# Patient Record
Sex: Female | Born: 1940 | Race: White | Hispanic: No | Marital: Married | Smoking: Never smoker
Health system: Southern US, Community
[De-identification: ages and names within clinical notes are randomized; demographics above are authoritative.]

## PROBLEM LIST (undated history)

## (undated) DIAGNOSIS — E049 Nontoxic goiter, unspecified: Secondary | ICD-10-CM

## (undated) DIAGNOSIS — N189 Chronic kidney disease, unspecified: Secondary | ICD-10-CM

## (undated) DIAGNOSIS — E079 Disorder of thyroid, unspecified: Secondary | ICD-10-CM

## (undated) DIAGNOSIS — I341 Nonrheumatic mitral (valve) prolapse: Secondary | ICD-10-CM

## (undated) DIAGNOSIS — I10 Essential (primary) hypertension: Secondary | ICD-10-CM

## (undated) DIAGNOSIS — K589 Irritable bowel syndrome without diarrhea: Secondary | ICD-10-CM

## (undated) DIAGNOSIS — F419 Anxiety disorder, unspecified: Secondary | ICD-10-CM

## (undated) DIAGNOSIS — M810 Age-related osteoporosis without current pathological fracture: Secondary | ICD-10-CM

## (undated) HISTORY — DX: Essential (primary) hypertension: I10

## (undated) HISTORY — DX: Age-related osteoporosis without current pathological fracture: M81.0

## (undated) HISTORY — DX: Nonrheumatic mitral (valve) prolapse: I34.1

## (undated) HISTORY — DX: Disorder of thyroid, unspecified: E07.9

## (undated) HISTORY — PX: ABLATION: SHX5711

## (undated) HISTORY — DX: Chronic kidney disease, unspecified: N18.9

## (undated) HISTORY — DX: Nontoxic goiter, unspecified: E04.9

## (undated) HISTORY — DX: Anxiety disorder, unspecified: F41.9

## (undated) HISTORY — DX: Irritable bowel syndrome, unspecified: K58.9

---
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
## 1966-10-22 HISTORY — PX: BREAST LUMPECTOMY: SHX2

## 1997-10-22 HISTORY — PX: ABDOMINAL HYSTERECTOMY: SHX81

## 1998-06-20 ENCOUNTER — Ambulatory Visit (HOSPITAL_COMMUNITY): Admission: RE | Admit: 1998-06-20 | Discharge: 1998-06-20 | Payer: Self-pay | Admitting: Gastroenterology

## 1999-04-04 ENCOUNTER — Other Ambulatory Visit: Admission: RE | Admit: 1999-04-04 | Discharge: 1999-04-04 | Payer: Self-pay | Admitting: *Deleted

## 1999-06-14 ENCOUNTER — Ambulatory Visit (HOSPITAL_COMMUNITY): Admission: RE | Admit: 1999-06-14 | Discharge: 1999-06-14 | Payer: Self-pay | Admitting: Gastroenterology

## 1999-06-28 ENCOUNTER — Ambulatory Visit (HOSPITAL_COMMUNITY): Admission: RE | Admit: 1999-06-28 | Discharge: 1999-06-28 | Payer: Self-pay | Admitting: Gastroenterology

## 1999-06-28 ENCOUNTER — Encounter: Payer: Self-pay | Admitting: Gastroenterology

## 2001-05-06 ENCOUNTER — Other Ambulatory Visit: Admission: RE | Admit: 2001-05-06 | Discharge: 2001-05-06 | Payer: Self-pay | Admitting: *Deleted

## 2002-09-14 ENCOUNTER — Encounter: Payer: Self-pay | Admitting: Gastroenterology

## 2002-09-14 ENCOUNTER — Encounter: Admission: RE | Admit: 2002-09-14 | Discharge: 2002-09-14 | Payer: Self-pay | Admitting: Gastroenterology

## 2007-01-02 ENCOUNTER — Emergency Department (HOSPITAL_COMMUNITY): Admission: EM | Admit: 2007-01-02 | Discharge: 2007-01-02 | Payer: Self-pay | Admitting: Emergency Medicine

## 2007-07-17 ENCOUNTER — Ambulatory Visit: Payer: Self-pay | Admitting: *Deleted

## 2008-09-14 ENCOUNTER — Encounter (INDEPENDENT_AMBULATORY_CARE_PROVIDER_SITE_OTHER): Payer: Self-pay | Admitting: Interventional Radiology

## 2008-09-14 ENCOUNTER — Encounter: Admission: RE | Admit: 2008-09-14 | Discharge: 2008-09-14 | Payer: Self-pay | Admitting: Internal Medicine

## 2008-09-14 ENCOUNTER — Other Ambulatory Visit: Admission: RE | Admit: 2008-09-14 | Discharge: 2008-09-14 | Payer: Self-pay | Admitting: Interventional Radiology

## 2010-06-06 ENCOUNTER — Emergency Department (HOSPITAL_COMMUNITY)
Admission: EM | Admit: 2010-06-06 | Discharge: 2010-06-06 | Payer: Self-pay | Source: Home / Self Care | Admitting: Emergency Medicine

## 2010-12-07 ENCOUNTER — Other Ambulatory Visit: Payer: Self-pay | Admitting: Internal Medicine

## 2010-12-07 DIAGNOSIS — R4182 Altered mental status, unspecified: Secondary | ICD-10-CM

## 2010-12-08 ENCOUNTER — Ambulatory Visit
Admission: RE | Admit: 2010-12-08 | Discharge: 2010-12-08 | Disposition: A | Discharge status: Home / Self Care | Payer: Medicare Other | Source: Ambulatory Visit | Attending: Internal Medicine | Admitting: Internal Medicine

## 2010-12-08 DIAGNOSIS — R4182 Altered mental status, unspecified: Secondary | ICD-10-CM

## 2011-01-05 LAB — URINE CULTURE
Colony Count: NO GROWTH
Culture  Setup Time: 201108162044

## 2011-01-05 LAB — COMPREHENSIVE METABOLIC PANEL
AST: 22 U/L (ref 0–37)
Albumin: 3.6 g/dL (ref 3.5–5.2)
BUN: 14 mg/dL (ref 6–23)
Calcium: 9.3 mg/dL (ref 8.4–10.5)
Chloride: 108 mEq/L (ref 96–112)
Creatinine, Ser: 1.08 mg/dL (ref 0.4–1.2)
GFR calc Af Amer: >60 mL/min (ref >60)
Total Protein: 6.7 g/dL (ref 6.0–8.3)

## 2011-01-05 LAB — URINALYSIS, ROUTINE W REFLEX MICROSCOPIC
Bilirubin Urine: NEGATIVE
Ketones, ur: NEGATIVE mg/dL
Nitrite: NEGATIVE
Protein, ur: NEGATIVE mg/dL
pH: 7 (ref 5.0–8.0)

## 2011-01-05 LAB — URINE MICROSCOPIC-ADD ON

## 2011-01-05 LAB — GLUCOSE, CAPILLARY: Glucose-Capillary: 119 mg/dL — ABNORMAL HIGH (ref 70–99)

## 2011-01-05 LAB — DIFFERENTIAL
Basophils Absolute: 0 10*3/uL (ref 0.0–0.1)
Eosinophils Relative: 2 % (ref 0–5)
Lymphocytes Relative: 24 % (ref 12–46)
Lymphs Abs: 2 10*3/uL (ref 0.7–4.0)
Monocytes Absolute: 0.7 10*3/uL (ref 0.1–1.0)
Monocytes Relative: 8 % (ref 3–12)
Neutro Abs: 5.6 10*3/uL (ref 1.7–7.7)

## 2011-01-05 LAB — CBC
MCV: 90.7 fL (ref 78.0–100.0)
Platelets: 306 10*3/uL (ref 150–400)
RBC: 4.51 MIL/uL (ref 3.87–5.11)
RDW: 13.1 % (ref 11.5–15.5)
WBC: 8.5 10*3/uL (ref 4.0–10.5)

## 2011-01-05 LAB — APTT: aPTT: 25 seconds (ref 24–37)

## 2011-01-05 LAB — CK TOTAL AND CKMB (NOT AT ARMC)
CK, MB: 0.9 ng/mL (ref 0.3–4.0)
Total CK: 53 U/L (ref 7–177)

## 2011-01-05 LAB — TROPONIN I: Troponin I: 0.01 ng/mL (ref 0.00–0.06)

## 2011-03-06 NOTE — Procedures (Signed)
CAROTID DUPLEX EXAM   INDICATION:  Bilateral carotid bruit.   HISTORY:  Diabetes:  No  Cardiac:  Mitral valve prolapse  Hypertension:  Yes  Smoking:  No  Previous Surgery:  No  CV History:  No  Amaurosis Fugax No, Paresthesias No, Hemiparesis No                                       RIGHT             LEFT  Brachial systolic pressure:  Brachial Doppler waveforms:         biphasic          biphasic  Vertebral direction of flow:        antegrade         antegrade  DUPLEX VELOCITIES (cm/sec)  CCA peak systolic                   85                76  ECA peak systolic                   78                79  ICA peak systolic                   149 (mid)         152 (distal)  ICA end diastolic                   51                27  PLAQUE MORPHOLOGY:                  heterogenous      heterogenous  PLAQUE AMOUNT:                      mild              mild  PLAQUE LOCATION:                    ICA               ICA   IMPRESSION:  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  Antegrade bilateral vertebral arteries.   INCIDENTAL FINDINGS:  A nodule that measured 2.24 x 3.2 cm noted in the  left thyroid gland.   ___________________________________________  P. Liliane Bade, M.D.   MG/MEDQ  D:  07/17/2007  T:  07/18/2007  Job:  [id number]

## 2012-03-05 ENCOUNTER — Other Ambulatory Visit: Payer: Self-pay | Admitting: Family Medicine

## 2012-03-05 DIAGNOSIS — I6529 Occlusion and stenosis of unspecified carotid artery: Secondary | ICD-10-CM

## 2012-03-05 DIAGNOSIS — R0989 Other specified symptoms and signs involving the circulatory and respiratory systems: Secondary | ICD-10-CM

## 2012-03-05 DIAGNOSIS — E049 Nontoxic goiter, unspecified: Secondary | ICD-10-CM

## 2012-03-07 ENCOUNTER — Ambulatory Visit
Admission: RE | Admit: 2012-03-07 | Discharge: 2012-03-07 | Disposition: A | Discharge status: Home / Self Care | Payer: Medicare Other | Source: Ambulatory Visit | Attending: Family Medicine | Admitting: Family Medicine

## 2012-03-07 DIAGNOSIS — E049 Nontoxic goiter, unspecified: Secondary | ICD-10-CM

## 2012-03-07 DIAGNOSIS — R0989 Other specified symptoms and signs involving the circulatory and respiratory systems: Secondary | ICD-10-CM

## 2012-03-07 DIAGNOSIS — I6529 Occlusion and stenosis of unspecified carotid artery: Secondary | ICD-10-CM

## 2013-03-13 ENCOUNTER — Other Ambulatory Visit: Payer: Self-pay | Admitting: Family Medicine

## 2013-03-13 DIAGNOSIS — E049 Nontoxic goiter, unspecified: Secondary | ICD-10-CM

## 2013-03-19 ENCOUNTER — Ambulatory Visit
Admission: RE | Admit: 2013-03-19 | Discharge: 2013-03-19 | Disposition: A | Discharge status: Home / Self Care | Payer: Medicare Other | Source: Ambulatory Visit | Attending: Family Medicine | Admitting: Family Medicine

## 2013-03-19 DIAGNOSIS — E049 Nontoxic goiter, unspecified: Secondary | ICD-10-CM

## 2014-03-29 ENCOUNTER — Other Ambulatory Visit: Payer: Self-pay | Admitting: Family Medicine

## 2014-03-29 DIAGNOSIS — E041 Nontoxic single thyroid nodule: Secondary | ICD-10-CM

## 2014-04-12 ENCOUNTER — Encounter (INDEPENDENT_AMBULATORY_CARE_PROVIDER_SITE_OTHER): Payer: Self-pay

## 2014-04-12 ENCOUNTER — Ambulatory Visit
Admission: RE | Admit: 2014-04-12 | Discharge: 2014-04-12 | Disposition: A | Discharge status: Home / Self Care | Payer: Medicare Other | Source: Ambulatory Visit | Attending: Family Medicine | Admitting: Family Medicine

## 2014-04-12 DIAGNOSIS — E041 Nontoxic single thyroid nodule: Secondary | ICD-10-CM

## 2016-05-16 ENCOUNTER — Other Ambulatory Visit: Payer: Self-pay | Admitting: Family Medicine

## 2016-05-16 ENCOUNTER — Ambulatory Visit
Admission: RE | Admit: 2016-05-16 | Discharge: 2016-05-16 | Disposition: A | Discharge status: Home / Self Care | Payer: Medicare Other | Source: Ambulatory Visit | Attending: Family Medicine | Admitting: Family Medicine

## 2016-05-16 DIAGNOSIS — E041 Nontoxic single thyroid nodule: Secondary | ICD-10-CM

## 2016-12-18 ENCOUNTER — Other Ambulatory Visit: Payer: Self-pay | Admitting: Family Medicine

## 2016-12-18 DIAGNOSIS — I739 Peripheral vascular disease, unspecified: Secondary | ICD-10-CM

## 2016-12-20 ENCOUNTER — Ambulatory Visit
Admission: RE | Admit: 2016-12-20 | Discharge: 2016-12-20 | Disposition: A | Discharge status: Home / Self Care | Payer: Medicare Other | Source: Ambulatory Visit | Attending: Family Medicine | Admitting: Family Medicine

## 2016-12-20 DIAGNOSIS — I739 Peripheral vascular disease, unspecified: Secondary | ICD-10-CM

## 2017-03-16 IMAGING — US US THYROID
1 series · 13 of 25 positions shown · non-contrast
Comparison: 04/12/2014; 03/19/2013; 03/07/2012

CLINICAL DATA: Follow-up thyroid nodules. History of radioactive
iodine therapy in [REDACTED]. History of prior thyroid biopsy in
4998.

EXAM:
THYROID ULTRASOUND
TECHNIQUE: Ultrasound examination of the thyroid gland and adjacent soft
tissues was performed.

[Series 1: us thyroid · 0.07mm/px · 13 of 43 slices shown]
[im 1/43]
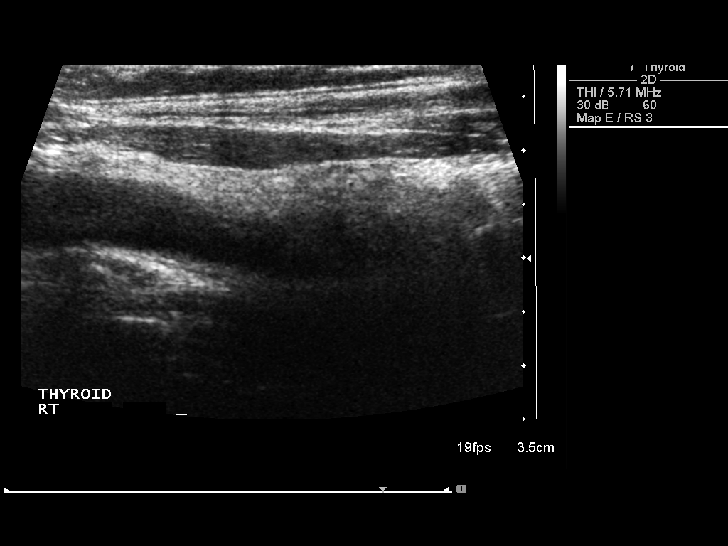
[im 4/43]
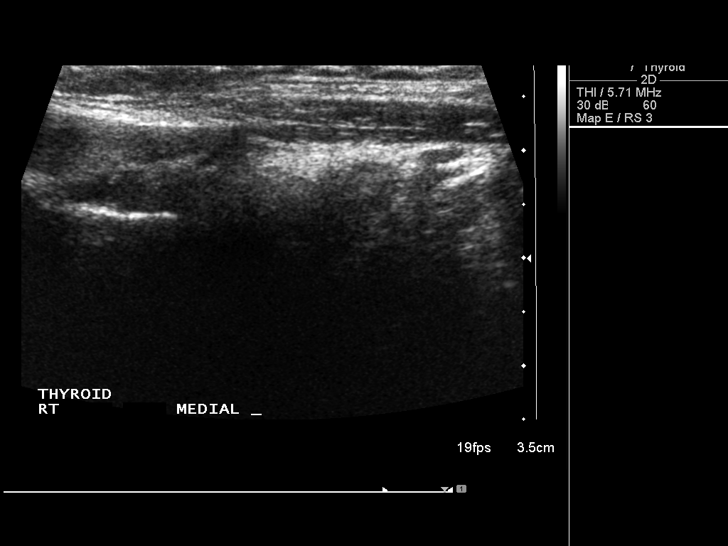
[im 8/43]
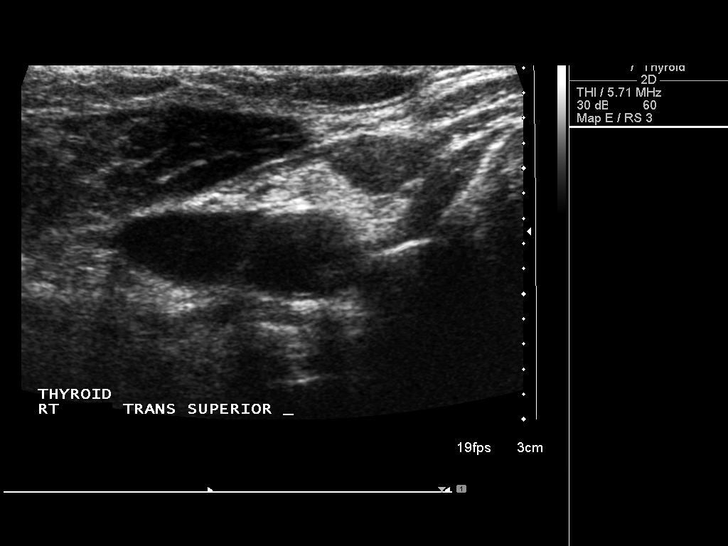
[im 11/43]
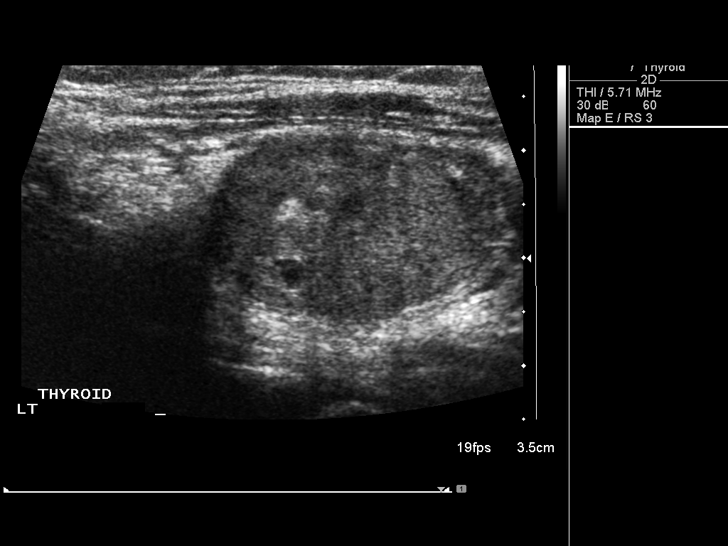
[im 15/43]
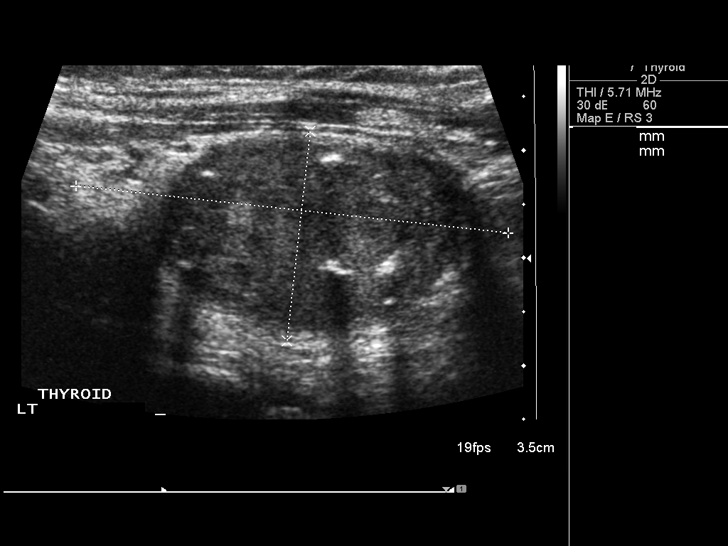
[im 18/43]
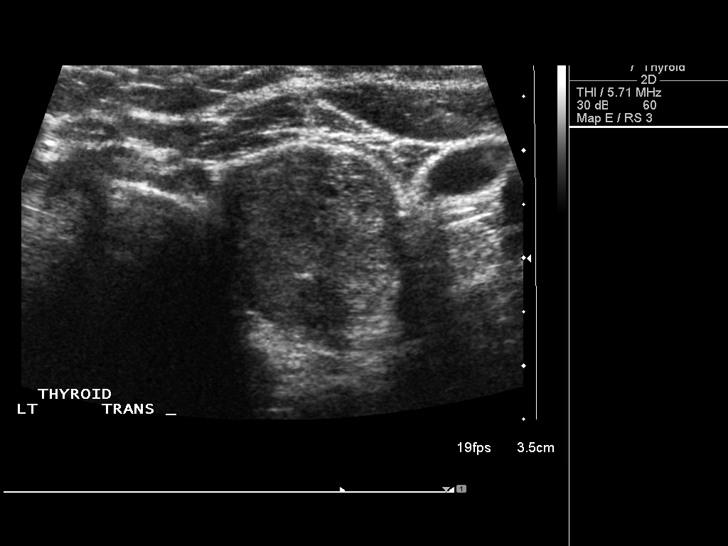
[im 22/43]
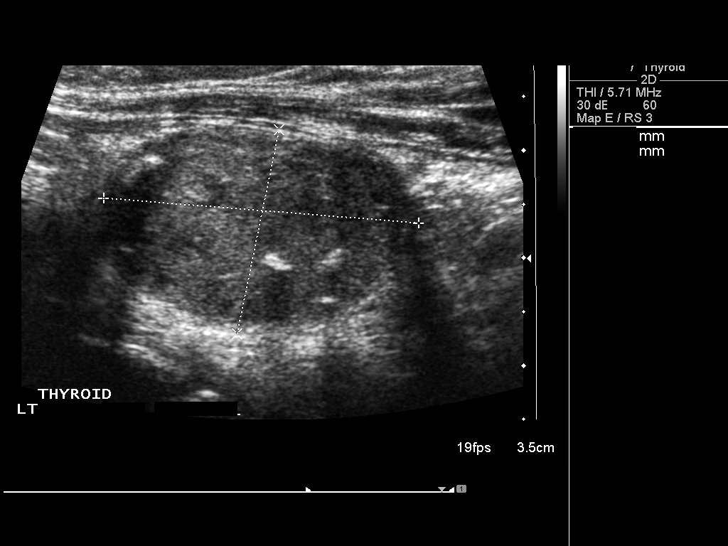
[im 25/43]
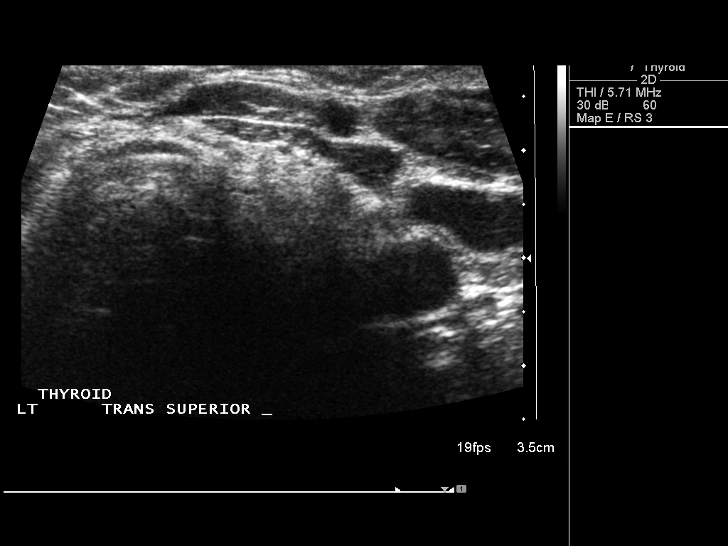
[im 29/43]
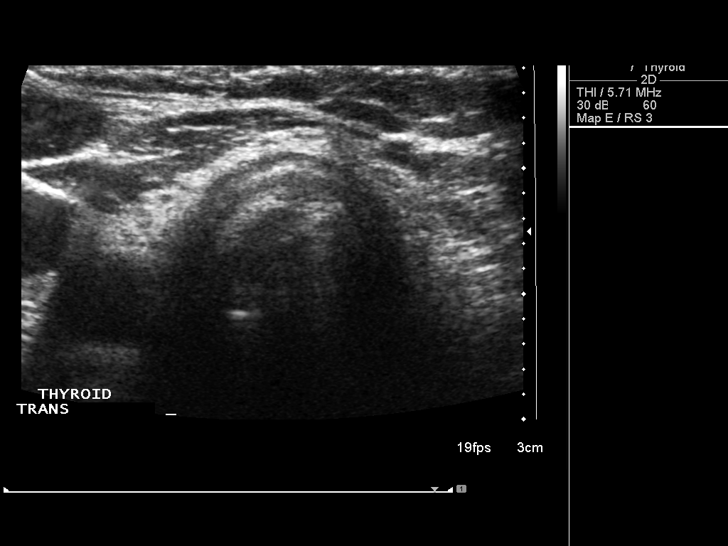
[im 32/43]
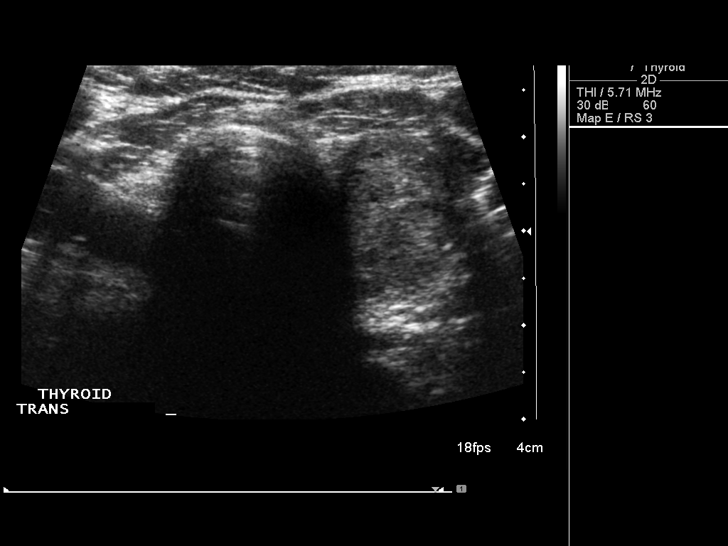
[im 36/43]
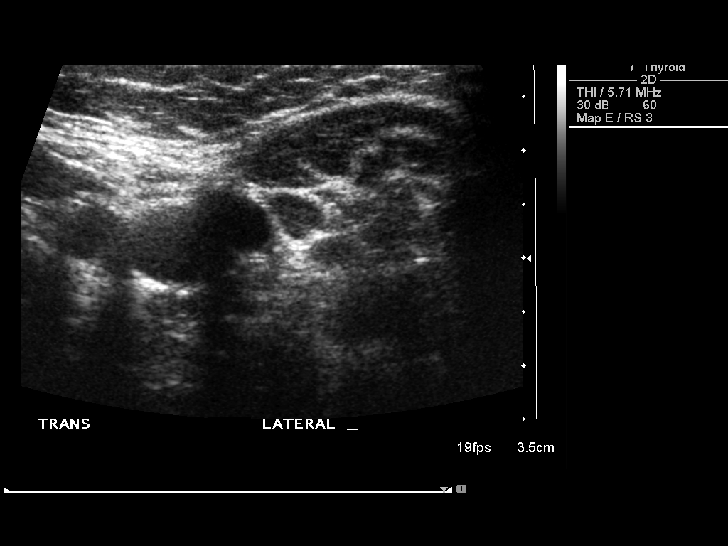
[im 39/43]
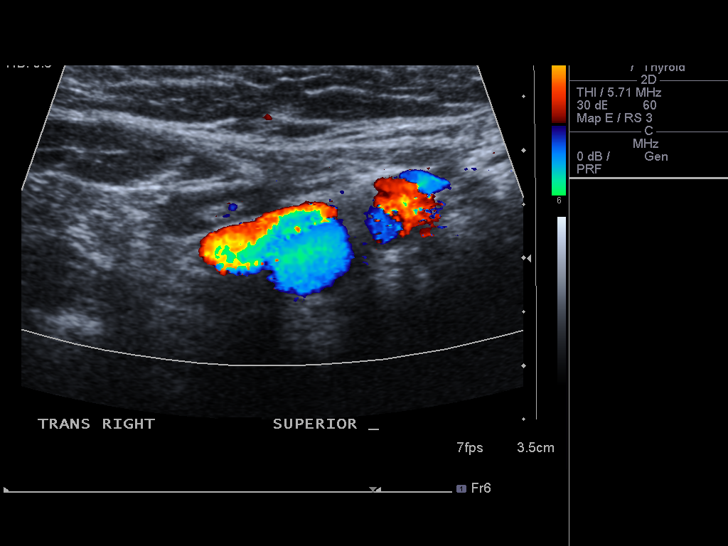
[im 43/43]
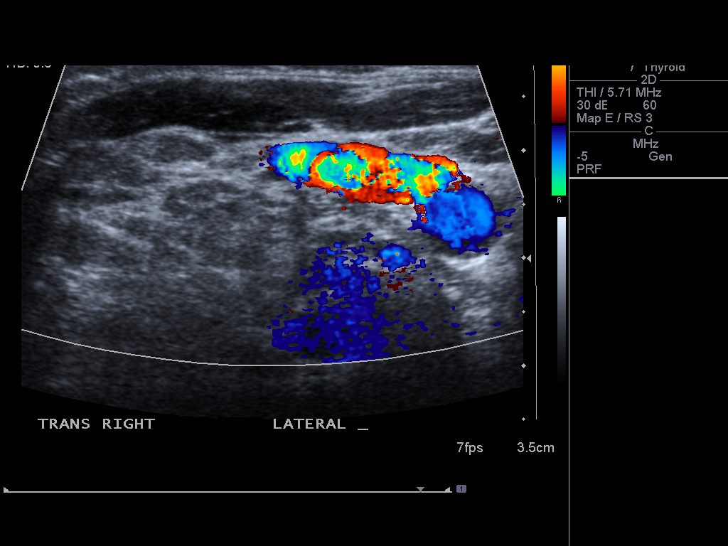

[13 of 25 positions shown; findings below may reference images not displayed]

FINDINGS: There is unchanged mild diffuse heterogeneity of the thyroid
parenchymal echotexture. No new or enlarging thyroid nodules.

Right thyroid lobe

Measurements: Diminutive in size measuring 3.0 x 0.5 x 0.7 cm,
unchanged, previously, 4.1 x 0.5 x 0.8 cm.

No discrete nodule or mass identified within the right lobe of the
thyroid.

Left thyroid lobe

Measurements: Normal in size measuring 4.0 x 2.0 x 2.0 cm,
unchanged, previously, 4.2 x 2.1 x 1.9 cm

Left, mid, inferior - 2.0 x 2.9 x 1.9 cm - mixed echogenic, solid
with internal echogenic foci with dystrophic posterior acoustic
shadowing compatible with calcifications - nodules grossly unchanged
since remote thyroid ultrasound performed [DATE] at which time the
nodule measured approximately 1.9 x 3.0 x 1.7 cm.

Isthmus

Thickness: Normal in size measures 0.2 cm in diameter

No discrete nodule or mass is identified within the thyroid isthmus.

Lymphadenopathy

None visualized.
IMPRESSION: 1. No new or enlarging thyroid nodules. Solitary complex
approximately 2.9 cm nodule within the left lobe of the thyroid is
unchanged since the [DATE] examination.
2. Unchanged atrophy of the right lobe of the thyroid likely
compatible with provided history of remote radioactive iodine
ablation

## 2017-07-23 ENCOUNTER — Encounter (INDEPENDENT_AMBULATORY_CARE_PROVIDER_SITE_OTHER): Payer: Self-pay | Admitting: Orthopaedic Surgery

## 2017-07-23 ENCOUNTER — Ambulatory Visit (INDEPENDENT_AMBULATORY_CARE_PROVIDER_SITE_OTHER): Payer: Medicare Other | Admitting: Orthopaedic Surgery

## 2017-07-23 ENCOUNTER — Ambulatory Visit (INDEPENDENT_AMBULATORY_CARE_PROVIDER_SITE_OTHER): Payer: Medicare Other

## 2017-07-23 DIAGNOSIS — G8929 Other chronic pain: Secondary | ICD-10-CM | POA: Diagnosis not present

## 2017-07-23 DIAGNOSIS — M1711 Unilateral primary osteoarthritis, right knee: Secondary | ICD-10-CM

## 2017-07-23 DIAGNOSIS — M25561 Pain in right knee: Secondary | ICD-10-CM

## 2017-07-23 MED ORDER — BUPIVACAINE HCL 0.5 % IJ SOLN
2.0000 mL | INTRAMUSCULAR | Status: AC | PRN
  Administered 2017-07-23: 2 mL via INTRA_ARTICULAR

## 2017-07-23 MED ORDER — METHYLPREDNISOLONE ACETATE 40 MG/ML IJ SUSP
40.0000 mg | INTRAMUSCULAR | Status: AC | PRN
  Administered 2017-07-23: 40 mg via INTRA_ARTICULAR

## 2017-07-23 MED ORDER — LIDOCAINE HCL 1 % IJ SOLN
2.0000 mL | INTRAMUSCULAR | Status: AC | PRN
  Administered 2017-07-23: 2 mL

## 2017-07-23 NOTE — Progress Notes (Signed)
Office Visit Note   Patient: Laura Raymond           Date of Birth: 05/16/1941           MRN: [id number] Visit Date: 07/23/2017              Requested by: Lupita Raider, MD [address] [place], [place] [address] PCP: Lupita Raider, MD   Assessment & Plan: Visit Diagnoses:  1. Primary osteoarthritis of right knee     Plan: Overall impression is degenerative lateral meniscal tear with moderate osteoarthritis. We performed a cortisone injection today as well as referred her to physical therapy for quadriceps strengthening. Questions encouraged and answered. If not better she should follow-up and we may need to consider advanced imaging.  Follow-Up Instructions: Return if symptoms worsen or fail to improve.   Orders:  Orders Placed This Encounter  Procedures  . XR KNEE 3 VIEW RIGHT   No orders of the defined types were placed in this encounter.     Procedures: Large Joint Inj Date/Time: 07/23/2017 10:59 AM Performed by: Tarry Kos Authorized by: Tarry Kos   Consent Given by:  Patient Timeout: prior to procedure the correct patient, procedure, and site was verified   Indications:  Pain Location:  Knee Site:  R knee Prep: patient was prepped and draped in usual sterile fashion   Needle Size:  22 G Approach:  Lateral Ultrasound Guidance: No   Fluoroscopic Guidance: No   Arthrogram: No   Medications:  2 mL bupivacaine 0.5 %; 40 mg methylPREDNISolone acetate 40 MG/ML; 2 mL lidocaine 1 % Patient tolerance:  Patient tolerated the procedure well with no immediate complications     Clinical Data: No additional findings.   Subjective: Chief Complaint  Patient presents with  . Right Knee - Pain    Patient is a very pleasant 76 year old female who comes in with one-year history of right lateral knee pain with giving way and catching sensation. She has not fallen but she is very afraid that she will. The pain is worse with prolonged flexion  of the knee or sitting. She does have start up pain and stiffness. Denies any numbness and tingling. Denies any previous surgeries.    Review of Systems  Constitutional: Negative.   HENT: Negative.   Eyes: Negative.   Respiratory: Negative.   Cardiovascular: Negative.   Endocrine: Negative.   Musculoskeletal: Negative.   Neurological: Negative.   Hematological: Negative.   Psychiatric/Behavioral: Negative.   All other systems reviewed and are negative.    Objective: Vital Signs: There were no vitals taken for this visit.  Physical Exam  Constitutional: She is oriented to person, place, and time. She appears well-developed and well-nourished.  HENT:  Head: Normocephalic and atraumatic.  Eyes: EOM are normal.  Neck: Neck supple.  Pulmonary/Chest: Effort normal.  Abdominal: Soft.  Neurological: She is alert and oriented to person, place, and time.  Skin: Skin is warm. Capillary refill takes less than 2 seconds.  Psychiatric: She has a normal mood and affect. Her behavior is normal. Judgment and thought content normal.  Nursing note and vitals reviewed.   Ortho Exam Right knee exam shows no joint effusion. Collaterals and cruciates are stable. Lateral joint line tenderness. She does have popping at the lateral joint line with McMurray testing. Specialty Comments:  No specialty comments available.  Imaging: Xr Knee 3 View Right  Result Date: 07/23/2017 Moderate OA    PMFS History: There  are no active problems to display for this patient.  No past medical history on file.  No family history on file.  No past surgical history on file. Social History   Occupational History  . Not on file.   Social History Main Topics  . Smoking status: Never Smoker  . Smokeless tobacco: Never Used  . Alcohol use Not on file  . Drug use: Unknown  . Sexual activity: Not on file

## 2017-08-26 HISTORY — PX: OTHER SURGICAL HISTORY: SHX169

## 2017-08-26 HISTORY — PX: COLONOSCOPY: SHX174

## 2018-01-07 ENCOUNTER — Ambulatory Visit: Payer: Medicare Other | Attending: Family Medicine | Admitting: Physical Therapy

## 2018-01-07 ENCOUNTER — Encounter: Payer: Self-pay | Admitting: Physical Therapy

## 2018-01-07 DIAGNOSIS — M6281 Muscle weakness (generalized): Secondary | ICD-10-CM | POA: Insufficient documentation

## 2018-01-07 DIAGNOSIS — R279 Unspecified lack of coordination: Secondary | ICD-10-CM | POA: Diagnosis present

## 2018-01-07 DIAGNOSIS — M62838 Other muscle spasm: Secondary | ICD-10-CM

## 2018-01-07 NOTE — Therapy (Signed)
[facility] [address] [place], [place], [address] Phone: [phone]   Fax:  [phone]  Physical Therapy Evaluation  Patient Details  Name: Laura StabsBarbara A Mcdaniel MRN: [id number] Date of Birth: 1941-07-27 Referring Provider: Dr. Rockney GheeKimberlee D. Clelia CroftShaw   Encounter Date: 01/07/2018  PT End of Session - 01/07/18 1238    Visit Number  1    Date for PT Re-Evaluation  03/04/18    Authorization Type  [employer] Medicare    PT Start Time  1100    PT Stop Time  1145    PT Time Calculation (min)  45 min    Activity Tolerance  Patient tolerated treatment well    Behavior During Therapy  WFL for tasks assessed/performed       Past Medical History:  Diagnosis Date  . Anxiety   . Goiter   . Hypertension   . IBS (irritable bowel syndrome)   . Mitral valve prolapse   . Osteoporosis   . Thyroid disease     Past Surgical History:  Procedure Laterality Date  . ABDOMINAL HYSTERECTOMY  1999  . ABLATION    . APPENDECTOMY  1962  . BREAST LUMPECTOMY Left 1968  . COLONOSCOPY  08/26/2017  . endocopsy  08/26/2017    There were no vitals filed for this visit.   Subjective Assessment - 01/07/18 1112    Subjective  Fecal incontinence started late 30's.   Started sporadic.  The past several years have been more frequent.  Thinks it is from the high forcep delivery.  Saw Dr. Marca AnconaKarki for gastroentiology. The doctor feels it is a weak internal sphincter.  Patient is able to pass a small stool without effort when not expecting.  Patient will constipate herself due to the leakage.  Taking the trash out and leaked stool. Patient carries a change in clothes.  Patient reports she will have a bowel movement when she goes to the bathroom. Patient will loose stool one time per day without realizing it.      Patient Stated Goals  reduce fecal leakage    Currently in Pain?  No/denies    Multiple Pain Sites  No         [facility] PT Assessment - 01/07/18  0001      Assessment   Medical Diagnosis  R15.9 Fecal incontinence    Referring Provider  Dr. Rockney GheeKimberlee D. Shaw    Onset Date/Surgical Date  07/22/18    Prior Therapy  None      Precautions   Precautions  Other (comment)    Precaution Comments  osteoporosis      Restrictions   Weight Bearing Restrictions  No      Balance Screen   Has the patient fallen in the past 6 months  No    Has the patient had a decrease in activity level because of a fear of falling?   No    Is the patient reluctant to leave their home because of a fear of falling?   No      Home Public house managernvironment   Living Environment  Private residence      Prior Function   Level of Independence  Independent    Vocation  Retired    Leisure  no exercise regularly      Cognition   Overall Cognitive Status  Within Functional Limits for tasks assessed      Observation/Other Assessments   Focus on Therapeutic Outcomes (FOTO)   patient is limited  by 50% due to fecal leakage      Posture/Postural Control   Posture/Postural Control  Postural limitations    Postural Limitations  Rounded Shoulders;Forward head;Increased thoracic kyphosis      ROM / Strength   AROM / PROM / Strength  AROM;PROM;Strength      AROM   Overall AROM Comments  lumbar ROM is full      PROM   Right Hip External Rotation   30    Left Hip Extension  0    Left Hip Flexion  105    Left Hip External Rotation   35    Left Hip ABduction  10      Strength   Right Hip Extension  4/5    Right Hip ABduction  4/5    Left Hip Extension  4/5    Left Hip ABduction  4/5      Right Hip   Right Hip Extension  0    Right Hip Flexion  125    Right Hip ABduction  20      Palpation   SI assessment   pelvis in correct alignment    Palpation comment  tightness in the abdomen             Objective measurements completed on examination: See above findings.    Pelvic Floor Special Questions - 01/07/18 0001    Are you Pregnant or attempting pregnancy?   No    Prior Pregnancies  Yes    Number of Pregnancies  3    Number of C-Sections  0    Number of Vaginal Deliveries  2    Any difficulty with labor and deliveries  Yes high forcep and episiotomy delivery    Episiotomy Performed  Yes    Currently Sexually Active  -- last time was hurt     Urinary Leakage  Yes    Activities that cause leaking  With strong urge few drops when trying to get to the bathroom    Fecal incontinence  Yes    Falling out feeling (prolapse)  No    External Perineal Exam  anal area is intact     Pelvic Floor Internal Exam  Patient confirms identificaiton and approves PT to assess muscle integrity and treatment    Exam Type  Rectal    Palpation  tickness located on the left side of the sphincter, decreased movement of the puborectalis    Strength  weak squeeze, no lift left anterior lateral internal sphincter barely contracts                 PT Short Term Goals - 01/07/18 1249      PT SHORT TERM GOAL #1   Title  independent with initial HEP    Time  4    Period  Weeks    Status  New    Target Date  02/04/18      PT SHORT TERM GOAL #2   Title  understand correct bowel movement to relax the pelvic floor and push out the stools    Time  4    Period  Weeks    Status  New    Target Date  02/04/18      PT SHORT TERM GOAL #3   Title  understand bowel health to reduce her from constipating herself    Time  4    Period  Weeks    Status  New    Target Date  02/04/18  PT SHORT TERM GOAL #4   Title  understand how to perform abdominal massage to promote peristalic motion of the intestines to promote bowel movement    Time  4    Period  Weeks    Status  New    Target Date  02/04/18        PT Long Term Goals - 01/07/18 1253      PT LONG TERM GOAL #1   Title  independent with HEP and understand how to progress herself    Time  8    Period  Weeks    Status  New    Target Date  03/04/18      PT LONG TERM GOAL #2   Title  fecal  incontinence decresaed >/= 75% and patient is aware of fecal leakage    Time  8    Period  Weeks    Status  New    Target Date  03/04/18      PT LONG TERM GOAL #3   Title  increased flexibility of bilateral hips so she is able to sit on the commode with her knees higher than hips to have a proper bowel movement    Time  8    Period  Weeks    Status  New    Target Date  03/04/18      PT LONG TERM GOAL #4   Title  does not have to wear extra toilet paper tucked into the anal region due to fecal leakage    Time  8    Period  Weeks    Status  New    Target Date  03/04/18      PT LONG TERM GOAL #5   Title  pelvic floor strength >/= 3/5 with good lift to reduce pelvic leakage    Time  5    Period  Weeks    Status  New    Target Date  03/04/18             Plan - 01/07/18 1239    Clinical Impression Statement  Patient is a 77 year old female with fecal incontince that has become progressively worse in the past 6 months.  Patient reports it began when she had her first pregnancy.  Patient reports she will not realize she has lost stools until she goes to the commode. Patient will eat foods that make her constipated so she is able to go out in public.  Patient has a bowel movement everytime she urinates that is a nugget size. Pelvic floor strength is 2/5 without a lift.  Internal sphincte has difficulty with contracting on the left anteriorly. The puborectalis has minimal contraction.  Thickness is felt on the left anal sphincter.  PROM of hips: right /left external rotation 30/35, extension 0/0, flexion 125/105, and abduction 20/10.  Bilateral hip abduction and abduction strength is 4/5.  Increased tightness in abdominal area. Patient will benefit from skilled therapy to improve coordination of the pelvic floor contraction and improve awareness of fecal leakage.     History and Personal Factors relevant to plan of care:  osteoporosis, hypothyroidism, Hysterectomy    Clinical Presentation   Evolving    Clinical Presentation due to:  frequent bowel movements, makes her constipated so she is able to go out, does not realize she passed stool    Clinical Decision Making  Moderate    Rehab Potential  Excellent    Clinical Impairments Affecting Rehab Potential  osteoporosis,  hypothyroidism, Hysterectomy    PT Frequency  1x / week    PT Duration  8 weeks    PT Treatment/Interventions  Biofeedback;Therapeutic activities;Therapeutic exercise;Patient/family education;Neuromuscular re-education;Manual techniques;Passive range of motion;Dry needling    PT Next Visit Plan  abdominal massage, internal soft tissue work with tapping to increase contraction, hip stretches, toileting, bowel health    PT Home Exercise Plan  toileting, abdominal massage    Consulted and Agree with Plan of Care  Patient       Patient will benefit from skilled therapeutic intervention in order to improve the following deficits and impairments:  Increased fascial restricitons, Decreased mobility, Increased muscle spasms, Decreased strength, Decreased range of motion, Decreased coordination  Visit Diagnosis: Muscle weakness (generalized) - Plan: PT plan of care cert/re-cert  Unspecified lack of coordination - Plan: PT plan of care cert/re-cert  Other muscle spasm - Plan: PT plan of care cert/re-cert     Problem List There are no active problems to display for this patient.   Eulis Foster, PT 01/07/18 1:00 PM   [facility] [address] [place], [place], [address] Phone: [phone]   Fax:  [phone]  Name: ANIKAH HOGGE MRN: [id number] Date of Birth: September 08, 1941

## 2018-01-14 ENCOUNTER — Encounter: Payer: Self-pay | Admitting: Physical Therapy

## 2018-01-14 ENCOUNTER — Ambulatory Visit: Payer: Medicare Other | Admitting: Physical Therapy

## 2018-01-14 DIAGNOSIS — R279 Unspecified lack of coordination: Secondary | ICD-10-CM

## 2018-01-14 DIAGNOSIS — M6281 Muscle weakness (generalized): Secondary | ICD-10-CM

## 2018-01-14 DIAGNOSIS — M62838 Other muscle spasm: Secondary | ICD-10-CM

## 2018-01-14 NOTE — Patient Instructions (Addendum)
Toileting Techniques for Bowel Movements (Defecation) Using your belly (abdomen) and pelvic floor muscles to have a bowel movement is usually instinctive.  Sometimes people can have problems with these muscles and have to relearn proper defecation (emptying) techniques.  If you have weakness in your muscles, organs that are falling out, decreased sensation in your pelvis, or ignore your urge to go, you may find yourself straining to have a bowel movement.  You are straining if you are: . holding your breath or taking in a huge gulp of air and holding it  . keeping your lips and jaw tensed and closed tightly . turning red in the face because of excessive pushing or forcing . developing or worsening your  hemorrhoids . getting faint while pushing . not emptying completely and have to defecate many times a day  If you are straining, you are actually making it harder for yourself to have a bowel movement.  Many people find they are pulling up with the pelvic floor muscles and closing off instead of opening the anus. Due to lack pelvic floor relaxation and coordination the abdominal muscles, one has to work harder to push the feces out.  Many people have never been taught how to defecate efficiently and effectively.  Notice what happens to your body when you are having a bowel movement.  While you are sitting on the toilet pay attention to the following areas: . Jaw and mouth position . Angle of your hips   . Whether your feet touch the ground or not . Arm placement  . Spine position . Waist . Belly tension . Anus (opening of the anal canal)  An Evacuation/Defecation Plan   Here are the 4 basic points:  1. Lean forward enough for your elbows to rest on your knees 2. Support your feet on the floor or use a low stool if your feet don't touch the floor  3. Push out your belly as if you have swallowed a beach ball-you should feel a widening of your waist 4. Open and relax your pelvic floor muscles,  rather than tightening around the anus      The following conditions my require modifications to your toileting posture:  . If you have had surgery in the past that limits your back, hip, pelvic, knee or ankle flexibility . Constipation   Your healthcare practitioner may make the following additional suggestions and adjustments:  1) Sit on the toilet  a) Make sure your feet are supported. b) Notice your hip angle and spine position-most people find it effective to lean forward or raise their knees, which can help the muscles around the anus to relax  c) When you lean forward, place your forearms on your thighs for support  2) Relax suggestions a) Breath deeply in through your nose and out slowly through your mouth as if you are smelling the flowers and blowing out the candles. b) To become aware of how to relax your muscles, contracting and releasing muscles can be helpful.  Pull your pelvic floor muscles in tightly by using the image of holding back gas, or closing around the anus (visualize making a circle smaller) and lifting the anus up and in.  Then release the muscles and your anus should drop down and feel open. Repeat 5 times ending with the feeling of relaxation. c) Keep your pelvic floor muscles relaxed; let your belly bulge out. d) The digestive tract starts at the mouth and ends at the anal opening, so be   sure to relax both ends of the tube.  Place your tongue on the roof of your mouth with your teeth separated.  This helps relax your mouth and will help to relax the anus at the same time.  3) Empty (defecation) a) Keep your pelvic floor and sphincter relaxed, then bulge your anal muscles.  Make the anal opening wide.  b) Stick your belly out as if you have swallowed a beach ball. c) Make your belly wall hard using your belly muscles while continuing to breathe. Doing this makes it easier to open your anus. d) Breath out and give a grunt (or try using other sounds such as  ahhhh, shhhhh, ohhhh or grrrrrrr).  4) Finish a) As you finish your bowel movement, pull the pelvic floor muscles up and in.  This will leave your anus in the proper place rather than remaining pushed out and down. If you leave your anus pushed out and down, it will start to feel as though that is normal and give you incorrect signals about needing to have a bowel movement.  Look at 8 inches  Eulis Fosterheryl Salman Wellen, PT [facility] [address] [place], [place] [address]  Copyright  VHI. All rights reserved.  About Abdominal Massage  Abdominal massage, also called external colon massage, is a self-treatment circular massage technique that can reduce and eliminate gas and ease constipation. The colon naturally contracts in waves in a clockwise direction starting from inside the right hip, moving up toward the ribs, across the belly, and down inside the left hip.  When you perform circular abdominal massage, you help stimulate your colon's normal wave pattern of movement called peristalsis.  It is most beneficial when done after eating.  Positioning You can practice abdominal massage with oil while lying down, or in the shower with soap.  Some people find that it is just as effective to do the massage through clothing while sitting or standing.  How to Massage Start by placing your finger tips or knuckles on your right side, just inside your hip bone.  . Make small circular movements while you move upward toward your rib cage.   . Once you reach the bottom right side of your rib cage, take your circular movements across to the left side of the bottom of your rib cage.  . Next, move downward until you reach the inside of your left hip bone.  This is the path your feces travel in your colon. . Continue to perform your abdominal massage in this pattern for 10 minutes each day.     You can apply as much pressure as is comfortable in your massage.  Start gently and build  pressure as you continue to practice.  Notice any areas of pain as you massage; areas of slight pain may be relieved as you massage, but if you have areas of significant or intense pain, consult with your healthcare provider.  Other Considerations . General physical activity including bending and stretching can have a beneficial massage-like effect on the colon.  Deep breathing can also stimulate the colon because breathing deeply activates the same nervous system that supplies the colon.   . Abdominal massage should always be used in combination with a bowel-conscious diet that is high in the proper type of fiber for you, fluids (primarily water), and a regular exercise program. Supine    Lie flat with pillow support. Allow body's muscles to relax. Place hands on belly. Inhale slowly and deeply  for __3_ seconds, so hands move up. Then take _3__ seconds to exhale. Repeat _10__ times. Do __2_ times a day.   Copyright  VHI. All rights reserved.

## 2018-01-14 NOTE — Therapy (Signed)
[facility] [address] [place], [place], [address] Phone: [phone]   Fax:  [phone]  Physical Therapy Treatment  Patient Details  Name: Laura Raymond MRN: [id number] Date of Birth: 01/23/1941 Referring Provider: Dr. Rockney Ghee D. Clelia Croft   Encounter Date: 01/14/2018  PT End of Session - 01/14/18 0846    Visit Number  2    Date for PT Re-Evaluation  03/04/18    Authorization Type  UHC Medicare    PT Start Time  0800    PT Stop Time  0844    PT Time Calculation (min)  44 min    Activity Tolerance  Patient tolerated treatment well    Behavior During Therapy  [facility] for tasks assessed/performed       Past Medical History:  Diagnosis Date  . Anxiety   . Goiter   . Hypertension   . IBS (irritable bowel syndrome)   . Mitral valve prolapse   . Osteoporosis   . Thyroid disease     Past Surgical History:  Procedure Laterality Date  . ABDOMINAL HYSTERECTOMY  1999  . ABLATION    . APPENDECTOMY  1962  . BREAST LUMPECTOMY Left 1968  . COLONOSCOPY  08/26/2017  . endocopsy  08/26/2017    There were no vitals filed for this visit.  Subjective Assessment - 01/14/18 0807    Subjective  I felt good after the eval. No changes since then. Leakage is the same and no changes. When I have more bowels I have to urinate more. I take an immodium.     Patient Stated Goals  reduce fecal leakage    Currently in Pain?  No/denies    Multiple Pain Sites  No                No data recorded       [facility] Adult PT Treatment/Exercise - 01/14/18 0001      Therapeutic Activites    Therapeutic Activities  Other Therapeutic Activities    Other Therapeutic Activities  toileting technique with knees elevated      Neuro Re-ed    Neuro Re-ed Details   abdominal breathing in sitting and supine with tactile cues  to expand the abdomen instead of the chest      Manual Therapy   Manual Therapy  Soft tissue  mobilization;Myofascial release    Soft tissue mobilization  circular massage to stimulate peristalic movement of the intestines, diaphgram, rectus femorus, obliques, lower abdoment    Myofascial Release  lower abdoment to release the fascia             PT Education - 01/14/18 0844    Education provided  Yes    Education Details  abdominal breathing, toileting, abdominal massage    Person(s) Educated  Patient    Methods  Explanation;Demonstration;Tactile cues;Verbal cues;Handout    Comprehension  Returned demonstration;Verbalized understanding       PT Short Term Goals - 01/14/18 0850      PT SHORT TERM GOAL #1   Title  independent with initial HEP    Baseline  still learning    Time  4    Period  Weeks    Status  On-going      PT SHORT TERM GOAL #2   Title  understand correct bowel movement to relax the pelvic floor and push out the stools    Baseline  still learning    Time  4    Period  Weeks    Status  On-going      PT SHORT TERM GOAL #3   Title  understand bowel health to reduce her from constipating herself    Time  4    Period  Weeks    Status  New      PT SHORT TERM GOAL #4   Title  understand how to perform abdominal massage to promote peristalic motion of the intestines to promote bowel movement    Baseline  just learned    Time  4    Period  Weeks    Status  On-going        PT Long Term Goals - 01/07/18 1253      PT LONG TERM GOAL #1   Title  independent with HEP and understand how to progress herself    Time  8    Period  Weeks    Status  New    Target Date  03/04/18      PT LONG TERM GOAL #2   Title  fecal incontinence decresaed >/= 75% and patient is aware of fecal leakage    Time  8    Period  Weeks    Status  New    Target Date  03/04/18      PT LONG TERM GOAL #3   Title  increased flexibility of bilateral hips so she is able to sit on the commode with her knees higher than hips to have a proper bowel movement    Time  8     Period  Weeks    Status  New    Target Date  03/04/18      PT LONG TERM GOAL #4   Title  does not have to wear extra toilet paper tucked into the anal region due to fecal leakage    Time  8    Period  Weeks    Status  New    Target Date  03/04/18      PT LONG TERM GOAL #5   Title  pelvic floor strength >/= 3/5 with good lift to reduce pelvic leakage    Time  5    Period  Weeks    Status  New    Target Date  03/04/18            Plan - 01/14/18 0847    Clinical Impression Statement  Patient has a tight abdomen making it difficult to expand for dipahgramtic breathing.  Patient had some bowel sounds with soft tissue work.  Patient understands the mechanics of a bowel movement. Patient understands how to elevate her knees above her hips for a bowel movement but further instruction is needed.  Patient will benefit from skilled therapy to improve coordination fo the pelvic floor contraction and improve awareness of fecal leakage.     Rehab Potential  Excellent    Clinical Impairments Affecting Rehab Potential  osteoporosis, hypothyroidism, Hysterectomy    PT Frequency  1x / week    PT Duration  8 weeks    PT Treatment/Interventions  Biofeedback;Therapeutic activities;Therapeutic exercise;Patient/family education;Neuromuscular re-education;Manual techniques;Passive range of motion;Dry needling    PT Next Visit Plan  abdominal massage, internal soft tissue work with tapping to increase contraction, hip stretches, toileting, bowel health    PT Home Exercise Plan  toileting,     Recommended Other Services  MD signed initial eval    Consulted and Agree with Plan of Care  Patient  Patient will benefit from skilled therapeutic intervention in order to improve the following deficits and impairments:  Increased fascial restricitons, Decreased mobility, Increased muscle spasms, Decreased strength, Decreased range of motion, Decreased coordination  Visit Diagnosis: Muscle weakness  (generalized)  Unspecified lack of coordination  Other muscle spasm     Problem List There are no active problems to display for this patient.   Eulis Foster, PT 01/14/18 8:52 AM   [facility] [address] [place], [place], [address] Phone: [phone]   Fax:  [phone]  Name: Laura Raymond MRN: [id number] Date of Birth: December 20, 1940

## 2018-01-22 ENCOUNTER — Encounter: Payer: Self-pay | Admitting: Physical Therapy

## 2018-01-22 ENCOUNTER — Ambulatory Visit: Payer: Medicare Other | Attending: Family Medicine | Admitting: Physical Therapy

## 2018-01-22 DIAGNOSIS — M6281 Muscle weakness (generalized): Secondary | ICD-10-CM | POA: Diagnosis not present

## 2018-01-22 DIAGNOSIS — R279 Unspecified lack of coordination: Secondary | ICD-10-CM

## 2018-01-22 DIAGNOSIS — M62838 Other muscle spasm: Secondary | ICD-10-CM

## 2018-01-22 NOTE — Patient Instructions (Addendum)
Introduction to Bowel Health Diet and daily habits can help you predict when your bowels will move on a regular basis.  The consistency and quantity of the stool is usually more important than the frequency.  The goal is to have a regular bowel movement that is soft but formed.   Tips on Emptying Regularly . Eat breakfast.  Usually the best time of day for a bowel movement will be a half hour to an hour after eating.  These times are best because the body uses the gastrocolic reflex, a stimulation of bowel motion that occurs with eating, to help produce a bowel movement.  For some people even a simple hot drink in the morning can help the reflex action begin. . Eat all your meals at a predictable time each day.  The bowel functions best when food is introduced at the same regular intervals. . The amount of food eaten at a given time of day should be about the same size from day to day.  The bowel functions best when food is introduced in similar quantities from day to day. It is fine to have a small breakfast and a large lunch, or vice versa, just be consistent. . Eat two servings of fruit or vegetables and at least one serving of a complex carbohydrates (whole grains such as brown rice, bran, whole wheat bread, or oatmeal) at each meal. . Drink plenty of water-ideally eight glasses a day.  Be sure to increase your water intake if you are increasing fiber into your diet.  Maintain Healthy Habits . Exercise daily.  You may exercise at any time of day, but you may find that bowel function is helped most if the exercise is at a consistent time each day. . Make sure that you are not rushed and have convenient access to a bathroom at your selected time to empty your bowels.       Adduction: Hip - Knees Together With Pelvic Floor (Hook-Lying)    Lie with hips and knees bent, towel roll between knees. Squeeze pelvic floor while pushing knees together. Hold for _5__ seconds. Rest for __5_ seconds.  Repeat 10___ times. Do _3__ times a day.   Copyright  VHI. All rights reserved.    [facility] [address], [address] [place], [place] [address] Phone # [phone] Fax [phone]

## 2018-01-22 NOTE — Therapy (Signed)
[facility] [address] [place], [place], [address] Phone: [phone]   Fax:  [phone]  Physical Therapy Treatment  Patient Details  Name: LYLLIANA KITAMURA MRN: [id number] Date of Birth: [birth year] Referring Provider: Dr. Rockney Ghee D. Clelia Croft   Encounter Date: 01/22/2018  PT End of Session - 01/22/18 1142    Visit Number  3    Date for PT Re-Evaluation  03/04/18    Authorization Type  UHC Medicare    PT Start Time  1145    PT Stop Time  1225    PT Time Calculation (min)  40 min    Activity Tolerance  Patient tolerated treatment well    Behavior During Therapy  WFL for tasks assessed/performed       Past Medical History:  Diagnosis Date  . Anxiety   . Goiter   . Hypertension   . IBS (irritable bowel syndrome)   . Mitral valve prolapse   . Osteoporosis   . Thyroid disease     Past Surgical History:  Procedure Laterality Date  . ABDOMINAL HYSTERECTOMY  1999  . ABLATION    . APPENDECTOMY  1962  . BREAST LUMPECTOMY Left 1968  . COLONOSCOPY  08/26/2017  . endocopsy  08/26/2017    There were no vitals filed for this visit.  Subjective Assessment - 01/22/18 1148    Subjective  I have had a couple of episodes of leakage.  Starts with a loose bowel movement. When I lift something heavy or slight exertion causes me to leak bowels.  I have tried to elevate my knees with bowel movement which helps.     Patient Stated Goals  reduce fecal leakage    Currently in Pain?  No/denies                       [facility] Adult PT Treatment/Exercise - 01/22/18 0001      Self-Care   Self-Care  Other Self-Care Comments digestive system; bowel health    Other Self-Care Comments   reviewed the abdominal massage, toileting,       Therapeutic Activites    Therapeutic Activities  Other Therapeutic Activities    Other Therapeutic Activities  toileting technique with knees elevated; diaphgramatic breathing,  pelvis floor contration at end      Neuro Re-ed    Neuro Re-ed Details   diaphgramatic breathing in sitting with tactile cues to the abdomen,       Manual Therapy   Manual Therapy  Myofascial release;Soft tissue mobilization    Soft tissue mobilization  circular massage to stimulate peristalic movement of the intestines, diaphgram, rectus femorus, obliques, lower abdoment    Myofascial Release  lower abdoment to release the fascia             PT Education - 01/22/18 1230    Education provided  Yes    Education Details  information on digestive system and bowel health, pelvic floor contraction, reviewed past HEP    Person(s) Educated  Patient    Methods  Explanation;Demonstration;Verbal cues;Handout    Comprehension  Returned demonstration;Verbalized understanding       PT Short Term Goals - 01/22/18 1233      PT SHORT TERM GOAL #1   Title  independent with initial HEP    Baseline  still learning    Time  4    Period  Weeks    Status  On-going      PT  SHORT TERM GOAL #2   Title  understand correct bowel movement to relax the pelvic floor and push out the stools    Baseline  still learning    Time  4    Period  Weeks    Status  On-going      PT SHORT TERM GOAL #3   Title  understand bowel health to reduce her from constipating herself    Time  4    Period  Weeks    Status  Achieved      PT SHORT TERM GOAL #4   Title  understand how to perform abdominal massage to promote peristalic motion of the intestines to promote bowel movement    Time  4    Period  Weeks    Status  Achieved        PT Long Term Goals - 01/07/18 1253      PT LONG TERM GOAL #1   Title  independent with HEP and understand how to progress herself    Time  8    Period  Weeks    Status  New    Target Date  03/04/18      PT LONG TERM GOAL #2   Title  fecal incontinence decresaed >/= 75% and patient is aware of fecal leakage    Time  8    Period  Weeks    Status  New    Target Date   03/04/18      PT LONG TERM GOAL #3   Title  increased flexibility of bilateral hips so she is able to sit on the commode with her knees higher than hips to have a proper bowel movement    Time  8    Period  Weeks    Status  New    Target Date  03/04/18      PT LONG TERM GOAL #4   Title  does not have to wear extra toilet paper tucked into the anal region due to fecal leakage    Time  8    Period  Weeks    Status  New    Target Date  03/04/18      PT LONG TERM GOAL #5   Title  pelvic floor strength >/= 3/5 with good lift to reduce pelvic leakage    Time  5    Period  Weeks    Status  New    Target Date  03/04/18            Plan - 01/22/18 1142    Clinical Impression Statement  Patient continued to need review of toileting technique and daiphragmatic breathing.  Patient has had several accidents since last visit.  Patient is able to do the abdominal massage.  Patient had increased expansion of the abdomen and lower rib cage after therapy.  Patient able to perform pelvic floor contraction with hihp adduction.  Patient will benefit from skilled therapy to improve coordination for the pelvic floor contraction and improve awareness of fecal leakage.     Rehab Potential  Excellent    Clinical Impairments Affecting Rehab Potential  osteoporosis, hypothyroidism, Hysterectomy    PT Frequency  1x / week    PT Duration  8 weeks    PT Treatment/Interventions  Biofeedback;Therapeutic activities;Therapeutic exercise;Patient/family education;Neuromuscular re-education;Manual techniques;Passive range of motion;Dry needling    PT Next Visit Plan  abdominal massage, internal soft tissue work with tapping to increase contraction, hip stretches, progress pelvic floor exercise with hip  abduction with band; pelvic floor contraction in sitting.     PT Home Exercise Plan  progress as needed    Consulted and Agree with Plan of Care  Patient       Patient will benefit from skilled therapeutic  intervention in order to improve the following deficits and impairments:  Increased fascial restricitons, Decreased mobility, Increased muscle spasms, Decreased strength, Decreased range of motion, Decreased coordination  Visit Diagnosis: Muscle weakness (generalized)  Unspecified lack of coordination  Other muscle spasm     Problem List There are no active problems to display for this patient.  Eulis FosterCheryl Kylan Veach, PT 01/22/18 12:35 PM   [facility] [address] [place], [place], [address] Phone: [phone]   Fax:  [phone]  Name: Karmen StabsBarbara A Loscalzo MRN: [id number] Date of Birth: 11-19-1940

## 2018-01-29 ENCOUNTER — Encounter: Payer: Self-pay | Admitting: Physical Therapy

## 2018-01-29 ENCOUNTER — Ambulatory Visit: Payer: Medicare Other | Admitting: Physical Therapy

## 2018-01-29 DIAGNOSIS — M62838 Other muscle spasm: Secondary | ICD-10-CM

## 2018-01-29 DIAGNOSIS — M6281 Muscle weakness (generalized): Secondary | ICD-10-CM | POA: Diagnosis not present

## 2018-01-29 DIAGNOSIS — R279 Unspecified lack of coordination: Secondary | ICD-10-CM

## 2018-01-29 NOTE — Therapy (Signed)
[facility] [address] [place], [place], [address] Phone: [phone]   Fax:  [phone]  Physical Therapy Treatment  Patient Details  Name: Laura Raymond MRN: [id number] Date of Birth: [birth year] Referring Provider: Dr. Rockney Ghee D. Clelia Croft   Encounter Date: 01/29/2018  PT End of Session - 01/29/18 1152    Visit Number  4    Date for PT Re-Evaluation  03/04/18    Authorization Type  UHC Medicare    PT Start Time  1145    PT Stop Time  1225    PT Time Calculation (min)  40 min    Activity Tolerance  Patient tolerated treatment well    Behavior During Therapy  WFL for tasks assessed/performed       Past Medical History:  Diagnosis Date  . Anxiety   . Goiter   . Hypertension   . IBS (irritable bowel syndrome)   . Mitral valve prolapse   . Osteoporosis   . Thyroid disease     Past Surgical History:  Procedure Laterality Date  . ABDOMINAL HYSTERECTOMY  1999  . ABLATION    . APPENDECTOMY  1962  . BREAST LUMPECTOMY Left 1968  . COLONOSCOPY  08/26/2017  . endocopsy  08/26/2017    There were no vitals filed for this visit.  Subjective Assessment - 01/29/18 1153    Subjective  I have not had an insidence of fecal leakage this week.  The stool under my feet with correct posture is helping to fully empty her bowels. The intestinal massage hurts so I am not good about it.     Patient Stated Goals  reduce fecal leakage    Currently in Pain?  No/denies    Multiple Pain Sites  No                    Pelvic Floor Special Questions - 01/29/18 0001    Exam Type  Deferred        [facility] Adult PT Treatment/Exercise - 01/29/18 0001      Therapeutic Activites    Therapeutic Activities  Lifting    Lifting  contract pelvic floor and breath with lifing    Other Therapeutic Activities  contract pelvic floor after bowel movement      Manual Therapy   Manual Therapy  Soft tissue mobilization    Manual therapy comments  ILU massage to colon to assist in bowel movement and patient returned demonstration             PT Education - 01/29/18 1237    Education provided  Yes    Education Details  ILU massage for intestines; pelvic floor exericses progressed, squat with pelvic floor contraction and breathing    Person(s) Educated  Patient    Methods  Explanation;Demonstration;Verbal cues;Handout    Comprehension  Verbalized understanding;Returned demonstration       PT Short Term Goals - 01/29/18 1243      PT SHORT TERM GOAL #1   Title  independent with initial HEP    Time  4    Period  Weeks    Status  Achieved      PT SHORT TERM GOAL #2   Title  understand correct bowel movement to relax the pelvic floor and push out the stools    Time  4    Period  Weeks    Status  Achieved        PT Long Term Goals -  01/07/18 1253      PT LONG TERM GOAL #1   Title  independent with HEP and understand how to progress herself    Time  8    Period  Weeks    Status  New    Target Date  03/04/18      PT LONG TERM GOAL #2   Title  fecal incontinence decresaed >/= 75% and patient is aware of fecal leakage    Time  8    Period  Weeks    Status  New    Target Date  03/04/18      PT LONG TERM GOAL #3   Title  increased flexibility of bilateral hips so she is able to sit on the commode with her knees higher than hips to have a proper bowel movement    Time  8    Period  Weeks    Status  New    Target Date  03/04/18      PT LONG TERM GOAL #4   Title  does not have to wear extra toilet paper tucked into the anal region due to fecal leakage    Time  8    Period  Weeks    Status  New    Target Date  03/04/18      PT LONG TERM GOAL #5   Title  pelvic floor strength >/= 3/5 with good lift to reduce pelvic leakage    Time  5    Period  Weeks    Status  New    Target Date  03/04/18            Plan - 01/29/18 1201    Clinical Impression Statement  Patient has not had  fecal leakage this week.  When patient has fecal leakage it is with lifting or after a loose bowel movement. Patient understands she has to breath with lifting so she is not  increasing intra-abdominal pressure to cause her leakage.  Patient will benefit from skilled therapy to improve coordination for the pelvic floor contraction and improve awareness of fecal leakage.     Rehab Potential  Excellent    Clinical Impairments Affecting Rehab Potential  osteoporosis, hypothyroidism, Hysterectomy    PT Frequency  1x / week    PT Duration  8 weeks    PT Treatment/Interventions  Biofeedback;Therapeutic activities;Therapeutic exercise;Patient/family education;Neuromuscular re-education;Manual techniques;Passive range of motion;Dry needling    PT Next Visit Plan  abdominal massage; pelvic floor contraction with lifting, pelvic floor EMG    PT Home Exercise Plan  progress as needed    Consulted and Agree with Plan of Care  Patient       Patient will benefit from skilled therapeutic intervention in order to improve the following deficits and impairments:  Increased fascial restricitons, Decreased mobility, Increased muscle spasms, Decreased strength, Decreased range of motion, Decreased coordination  Visit Diagnosis: Muscle weakness (generalized)  Unspecified lack of coordination  Other muscle spasm     Problem List There are no active problems to display for this patient.   Eulis FosterCheryl Gray, PT 01/29/18 12:44 PM   [facility] [address] [place], [place], [address] Phone: [phone]   Fax:  [phone]  Name: Karmen StabsBarbara A Kauffmann MRN: [id number] Date of Birth: July 19, 1941

## 2018-01-29 NOTE — Patient Instructions (Addendum)
External Rotation: Hip - Knees Apart With Pelvic Floor (Hook-Lying)    Lie with hips and knees bent, band tied just above knees. Squeeze pelvic floor while pulling knees apart.  Repeat __20_ times. Do _1__ times a day.   Copyright  VHI. All rights reserved.    Bracing With Bridging (Hook-Lying)    With neutral spine, tighten pelvic floor and abdominals and hold. Lift bottom. Repeat _15__ times. Do _1__ times a day. Keep band wrapped around your knees.   Copyright  VHI. All rights reserved.  Unilateral Isometric Hip Flexion    Tighten stomach and pelvic floor and raise right knee to outstretched arm. Push gently, keeping arm straight, trunk rigid. Hold __5__ seconds. Repeat __10__ times per set. Then do the left knee. Do ___1_ sets per session. Do _1___ sessions per day.DO NOT HOLD BREATH  http://orth.exer.us/1099   Copyright  VHI. All rights reserved.   Squat: Double Leg (Supported)    With feet shoulder width apart, hold support. Squeeze pelvic floor, Squat, keeping lower leg vertical, knee in line with second toe. Use legs, do not pull up and down with arms. Repeat _5__ times per set. Rest _1__ seconds after set. Do _1__ sets per session. Make sure breathing http://plyo.exer.us/74   Copyright  VHI. All rights reserved.  [facility] [address], [address] [place], [place] [address] Phone # [phone] Fax [phone]

## 2018-02-05 ENCOUNTER — Encounter: Payer: Self-pay | Admitting: Physical Therapy

## 2018-02-05 ENCOUNTER — Ambulatory Visit: Payer: Medicare Other | Admitting: Physical Therapy

## 2018-02-05 DIAGNOSIS — R279 Unspecified lack of coordination: Secondary | ICD-10-CM

## 2018-02-05 DIAGNOSIS — M6281 Muscle weakness (generalized): Secondary | ICD-10-CM | POA: Diagnosis not present

## 2018-02-05 DIAGNOSIS — M62838 Other muscle spasm: Secondary | ICD-10-CM

## 2018-02-05 NOTE — Therapy (Signed)
[facility] [address], [address] [place], [place], [address] Phone: [phone]   Fax:  [phone]  Physical Therapy Treatment  Patient Details  Name: Laura Raymond MRN: [id number] Date of Birth: Jun 07, 1941 Referring Provider: Dr. Rockney GheeKimberlee D. Clelia CroftShaw   Encounter Date: 02/05/2018  PT End of Session - 02/05/18 1149    Visit Number  5    Date for PT Re-Evaluation  03/04/18    Authorization Type  [employer] Medicare    PT Start Time  1145    PT Stop Time  1225    PT Time Calculation (min)  40 min    Activity Tolerance  Patient tolerated treatment well    Behavior During Therapy  WFL for tasks assessed/performed       Past Medical History:  Diagnosis Date  . Anxiety   . Goiter   . Hypertension   . IBS (irritable bowel syndrome)   . Mitral valve prolapse   . Osteoporosis   . Thyroid disease     Past Surgical History:  Procedure Laterality Date  . ABDOMINAL HYSTERECTOMY  1999  . ABLATION    . APPENDECTOMY  1962  . BREAST LUMPECTOMY Left 1968  . COLONOSCOPY  08/26/2017  . endocopsy  08/26/2017    There were no vitals filed for this visit.  Subjective Assessment - 02/05/18 1152    Subjective  Leakage is 70% better. I feel like I have control now. I have more bowel movements in the morning and more productive.     Patient Stated Goals  reduce fecal leakage    Currently in Pain?  No/denies                                 PT Short Term Goals - 01/29/18 1243      PT SHORT TERM GOAL #1   Title  independent with initial HEP    Time  4    Period  Weeks    Status  Achieved      PT SHORT TERM GOAL #2   Title  understand correct bowel movement to relax the pelvic floor and push out the stools    Time  4    Period  Weeks    Status  Achieved        PT Long Term Goals - 02/05/18 1151      PT LONG TERM GOAL #1   Title  independent with HEP and understand how to progress herself    Baseline  still learning    Time  8    Period  Weeks    Status  On-going      PT LONG TERM GOAL #2   Title  fecal incontinence decresaed >/= 75% and patient is aware of fecal leakage    Baseline  70% better    Time  8    Period  Weeks    Status  On-going      PT LONG TERM GOAL #3   Title  increased flexibility of bilateral hips so she is able to sit on the commode with her knees higher than hips to have a proper bowel movement    Time  8    Period  Weeks    Status  Achieved      PT LONG TERM GOAL #4   Title  does not have to wear extra toilet paper tucked into the anal region  due to fecal leakage    Time  8    Period  Weeks    Status  On-going      PT LONG TERM GOAL #5   Title  pelvic floor strength >/= 3/5 with good lift to reduce pelvic leakage    Time  5    Period  Weeks    Status  On-going            Plan - 02/05/18 1215    Clinical Impression Statement  Patient reports her fecal leakage is 70% better.  She is contracting her abdominals with lifting. Fecal leakge is better when she is mindfull and contract her anal sphincter.  Patient has increased abdominal mobility and decreased puffiness.  Patient had fascial restrictions where her abdominal scar is. Patient will benefit from skilled therapy to improve coordination for the pelvic floor contraction and imporve awareness of fecal leakage.     Rehab Potential  Excellent    Clinical Impairments Affecting Rehab Potential  osteoporosis, hypothyroidism, Hysterectomy    PT Frequency  1x / week    PT Duration  8 weeks    PT Treatment/Interventions  Biofeedback;Therapeutic activities;Therapeutic exercise;Patient/family education;Neuromuscular re-education;Manual techniques;Passive range of motion;Dry needling    PT Next Visit Plan  abdominal massage; pelvic floor contraction with lifting, pelvic floor EMG    PT Home Exercise Plan  progress as needed    Consulted and Agree with Plan of Care  Patient       Patient will  benefit from skilled therapeutic intervention in order to improve the following deficits and impairments:  Increased fascial restricitons, Decreased mobility, Increased muscle spasms, Decreased strength, Decreased range of motion, Decreased coordination  Visit Diagnosis: Muscle weakness (generalized)  Unspecified lack of coordination  Other muscle spasm     Problem List There are no active problems to display for this patient.   Eulis Foster, PT 02/05/18 12:30 PM   [facility] [address] [place], [place], [address] Phone: [phone]   Fax:  [phone]  Name: Laura Raymond MRN: [id number] Date of Birth: September 27, 1941

## 2018-02-12 ENCOUNTER — Ambulatory Visit: Payer: Medicare Other | Admitting: Physical Therapy

## 2018-02-12 ENCOUNTER — Encounter: Payer: Self-pay | Admitting: Physical Therapy

## 2018-02-12 DIAGNOSIS — M62838 Other muscle spasm: Secondary | ICD-10-CM

## 2018-02-12 DIAGNOSIS — M6281 Muscle weakness (generalized): Secondary | ICD-10-CM

## 2018-02-12 DIAGNOSIS — R279 Unspecified lack of coordination: Secondary | ICD-10-CM

## 2018-02-12 NOTE — Therapy (Signed)
[facility] [address], [address] [place], [place], [address] Phone: [phone]   Fax:  [phone]  Physical Therapy Treatment  Patient Details  Name: Laura Raymond MRN: [id number] Date of Birth: 01-04-1941 Referring Provider: Dr. Rockney GheeKimberlee D. Clelia CroftShaw   Encounter Date: 02/12/2018  PT End of Session - 02/12/18 1155    Visit Number  6    Date for PT Re-Evaluation  03/04/18    Authorization Type  [employer] Medicare    PT Start Time  1145    PT Stop Time  1225    PT Time Calculation (min)  40 min    Activity Tolerance  Patient tolerated treatment well    Behavior During Therapy  WFL for tasks assessed/performed       Past Medical History:  Diagnosis Date  . Anxiety   . Goiter   . Hypertension   . IBS (irritable bowel syndrome)   . Mitral valve prolapse   . Osteoporosis   . Thyroid disease     Past Surgical History:  Procedure Laterality Date  . ABDOMINAL HYSTERECTOMY  1999  . ABLATION    . APPENDECTOMY  1962  . BREAST LUMPECTOMY Left 1968  . COLONOSCOPY  08/26/2017  . endocopsy  08/26/2017    There were no vitals filed for this visit.  Subjective Assessment - 02/12/18 1154    Subjective  I had leakage this weekend due to an episode that is like sludge. I still have the nuggets that fall out but it is less.      Patient Stated Goals  reduce fecal leakage    Currently in Pain?  No/denies    Multiple Pain Sites  No                    Pelvic Floor Special Questions - 02/12/18 0001    Biofeedback  resting tone 5 uv,,  diaphgramatic breathing decreases to to 4 uv; contract 10 seconds to 13 uv with control and breathing but has difficulty with relaxation;  sidely    Biofeedback sensor type  Surface rectal    Biofeedback Activity  Quick contraction;10 second hold;Obturator/adductor assist                PT Education - 02/12/18 1228    Education provided  Yes    Education Details  sidely  pelvic floor contraction    Person(s) Educated  Patient    Methods  Explanation;Demonstration;Verbal cues;Handout    Comprehension  Returned demonstration;Verbalized understanding       PT Short Term Goals - 01/29/18 1243      PT SHORT TERM GOAL #1   Title  independent with initial HEP    Time  4    Period  Weeks    Status  Achieved      PT SHORT TERM GOAL #2   Title  understand correct bowel movement to relax the pelvic floor and push out the stools    Time  4    Period  Weeks    Status  Achieved        PT Long Term Goals - 02/12/18 1156      PT LONG TERM GOAL #1   Title  independent with HEP and understand how to progress herself    Baseline  still learning    Time  8    Period  Weeks    Status  On-going      PT LONG TERM GOAL #2  Title  fecal incontinence decresaed >/= 75% and patient is aware of fecal leakage    Baseline  had one episode this weekend    Time  8    Period  Weeks    Status  On-going      PT LONG TERM GOAL #3   Title  increased flexibility of bilateral hips so she is able to sit on the commode with her knees higher than hips to have a proper bowel movement    Time  8    Period  Weeks    Status  Achieved      PT LONG TERM GOAL #4   Title  does not have to wear extra toilet paper tucked into the anal region due to fecal leakage    Time  8    Period  Weeks    Status  On-going      PT LONG TERM GOAL #5   Title  pelvic floor strength >/= 3/5 with good lift to reduce pelvic leakage    Time  5    Period  Weeks    Status  On-going            Plan - 02/12/18 1229    Clinical Impression Statement  Patient relaxes pelvic floor to 5 uv but after a contraction she has difficulty quickly relaxing unless she does diaphragmatic breathing.  Patient needs tactile cues to isolate her anal sphincter instead of engaging legs and buttocks.  Patient is able to contract for 10 seconds.  Patient has trouble with quick contractions and will substute the  abdominals and legs.  Patient will benefit from on skilled therapy to relax her pelvic floor muscles and work on pelvic floor muscle control.     Rehab Potential  Excellent    Clinical Impairments Affecting Rehab Potential  osteoporosis, hypothyroidism, Hysterectomy    PT Frequency  1x / week    PT Duration  8 weeks    PT Treatment/Interventions  Biofeedback;Therapeutic activities;Therapeutic exercise;Patient/family education;Neuromuscular re-education;Manual techniques;Passive range of motion;Dry needling    PT Next Visit Plan  abdominal massage; pelvic floor contraction with lifting, pelvic floor EMG    PT Home Exercise Plan  progress as needed    Consulted and Agree with Plan of Care  Patient       Patient will benefit from skilled therapeutic intervention in order to improve the following deficits and impairments:  Increased fascial restricitons, Decreased mobility, Increased muscle spasms, Decreased strength, Decreased range of motion, Decreased coordination  Visit Diagnosis: Muscle weakness (generalized)  Unspecified lack of coordination  Other muscle spasm     Problem List There are no active problems to display for this patient.   Eulis Foster, PT 02/12/18 1:25 PM   [facility] [address] [place], [place], [address] Phone: [phone]   Fax:  [phone]  Name: Laura Raymond MRN: [id number] Date of Birth: 1940/12/03

## 2018-02-12 NOTE — Patient Instructions (Addendum)
Quick Contraction: Gravity Eliminated (Side-Lying)    Lie on left side, hips and knees slightly bent. Quickly squeeze then fully relax pelvic floor. Perform _1__ sets of __5_. Rest for __1_ seconds between sets. Do _3__ times a day.   Copyright  VHI. All rights reserved.  Slow Contraction: Gravity Eliminated (Side-Lying)    Lie on left side, hips and knees slightly bent. Slowly squeeze pelvic floor for _10__ seconds. Rest for _10__ seconds. Repeat _10__ times. Do _3__ times a day.   Copyright  VHI. All rights reserved.  [facility] [address], [address] [place], [place] [address] Phone # [phone] Fax [phone]

## 2018-02-18 ENCOUNTER — Encounter: Payer: Self-pay | Admitting: Physical Therapy

## 2018-02-18 ENCOUNTER — Ambulatory Visit: Payer: Medicare Other | Admitting: Physical Therapy

## 2018-02-18 DIAGNOSIS — M6281 Muscle weakness (generalized): Secondary | ICD-10-CM

## 2018-02-18 DIAGNOSIS — M62838 Other muscle spasm: Secondary | ICD-10-CM

## 2018-02-18 DIAGNOSIS — R279 Unspecified lack of coordination: Secondary | ICD-10-CM

## 2018-02-18 NOTE — Therapy (Signed)
[facility] [address], [address] [place], [place], [address] Phone: [phone]   Fax:  [phone]  Physical Therapy Treatment  Patient Details  Name: Laura Raymond MRN: [id number] Date of Birth: 1941/03/24 Referring Provider: Dr. Rockney Ghee D. Clelia Croft   Encounter Date: 02/18/2018  PT End of Session - 02/18/18 1035    Visit Number  7    Date for PT Re-Evaluation  03/04/18    Authorization Type  [employer] Medicare    PT Start Time  1015    PT Stop Time  1055    PT Time Calculation (min)  40 min    Activity Tolerance  Patient tolerated treatment well    Behavior During Therapy  WFL for tasks assessed/performed       Past Medical History:  Diagnosis Date  . Anxiety   . Goiter   . Hypertension   . IBS (irritable bowel syndrome)   . Mitral valve prolapse   . Osteoporosis   . Thyroid disease     Past Surgical History:  Procedure Laterality Date  . ABDOMINAL HYSTERECTOMY  1999  . ABLATION    . APPENDECTOMY  1962  . BREAST LUMPECTOMY Left 1968  . COLONOSCOPY  08/26/2017  . endocopsy  08/26/2017    There were no vitals filed for this visit.  Subjective Assessment - 02/18/18 1024    Subjective  I am having a bad day. I have to becareful where I go with the leakage. I had something spicy yesterday.     Patient Stated Goals  reduce fecal leakage    Currently in Pain?  No/denies                       [facility] Adult PT Treatment/Exercise - 02/18/18 0001      Self-Care   Self-Care  Other Self-Care Comments    Other Self-Care Comments   discussed diet to inclue more fiber to increase consistency of her bowels and reduce the leakage      Neuro Re-ed    Neuro Re-ed Details   lay on back with legs on wall and do butterfly stretch while performing diaphgramtic breathing      Manual Therapy   Manual Therapy  Soft tissue mobilization;Myofascial release    Manual therapy comments  Sitting soft tissue work to  diaphragm while moving the trunk, ILU massage to colon to assist in bowel movement and patient returned demonstration    Soft tissue mobilization  transverse abdominus muscle    Myofascial Release  release in th elower abdominal  through the 3 planes of fascia              PT Education - 02/18/18 1033    Education provided  Yes    Education Details  how to add fiber to your diet    Person(s) Educated  Patient    Methods  Explanation;Handout    Comprehension  Verbalized understanding       PT Short Term Goals - 01/29/18 1243      PT SHORT TERM GOAL #1   Title  independent with initial HEP    Time  4    Period  Weeks    Status  Achieved      PT SHORT TERM GOAL #2   Title  understand correct bowel movement to relax the pelvic floor and push out the stools    Time  4    Period  Weeks  Status  Achieved        PT Long Term Goals - 02/12/18 1156      PT LONG TERM GOAL #1   Title  independent with HEP and understand how to progress herself    Baseline  still learning    Time  8    Period  Weeks    Status  On-going      PT LONG TERM GOAL #2   Title  fecal incontinence decresaed >/= 75% and patient is aware of fecal leakage    Baseline  had one episode this weekend    Time  8    Period  Weeks    Status  On-going      PT LONG TERM GOAL #3   Title  increased flexibility of bilateral hips so she is able to sit on the commode with her knees higher than hips to have a proper bowel movement    Time  8    Period  Weeks    Status  Achieved      PT LONG TERM GOAL #4   Title  does not have to wear extra toilet paper tucked into the anal region due to fecal leakage    Time  8    Period  Weeks    Status  On-going      PT LONG TERM GOAL #5   Title  pelvic floor strength >/= 3/5 with good lift to reduce pelvic leakage    Time  5    Period  Weeks    Status  On-going            Plan - 02/18/18 1324    Clinical Impression Statement  Patient has increased loose  stools and will use fiber to increase the consistency. Patient reports she is not having a good day.  Patient continues to have tightness in the lower abdominal area.  Patient  has difficulty with relaxing therefore it will tense up her muscles.  Patient will benefit from skilled therapy to relax her pelvic floor muscles and work on pelvic floor muscle control.     Rehab Potential  Excellent    Clinical Impairments Affecting Rehab Potential  osteoporosis, hypothyroidism, Hysterectomy    PT Frequency  1x / week    PT Duration  8 weeks    PT Treatment/Interventions  Biofeedback;Therapeutic activities;Therapeutic exercise;Patient/family education;Neuromuscular re-education;Manual techniques;Passive range of motion;Dry needling    PT Next Visit Plan  internal work to release the muscles,     PT Home Exercise Plan  progress as needed    Consulted and Agree with Plan of Care  Patient       Patient will benefit from skilled therapeutic intervention in order to improve the following deficits and impairments:  Increased fascial restricitons, Decreased mobility, Increased muscle spasms, Decreased strength, Decreased range of motion, Decreased coordination  Visit Diagnosis: Muscle weakness (generalized)  Unspecified lack of coordination  Other muscle spasm     Problem List There are no active problems to display for this patient.   Eulis Foster, PT 02/18/18 1:29 PM   [facility] [address], [address] [place], [place], [address] Phone: [phone]   Fax:  [phone]  Name: Laura Raymond MRN: [id number] Date of Birth: 01-12-1941

## 2018-02-18 NOTE — Patient Instructions (Signed)
Types of Fiber  There are two main types of fiber:  insoluble and soluble.  Both of these types can prevent and relieve constipation and diarrhea, although some people find one or the other to be more easily digested.  This handout details information about both types of fiber.  Insoluble Fiber       Functions of Insoluble Fiber . moves bulk through the intestines  . controls and balances the pH (acidity) in the intestines       Benefits of Insoluble Fiber . promotes regular bowel movement and prevents constipation  . removes fecal waste through colon in less time  . keeps an optimal pH in intestines to prevent microbes from producing cancer substances, therefore preventing colon cancer        Food Sources of Insoluble Fiber . whole-wheat products  . wheat bran "miller's bran" . corn bran  . flax seed or other seeds . vegetables such as green beans, broccoli, cauliflower and potato skins  . fruit skins and root vegetable skins  . popcorn . brown rice  Soluble Fiber       Functions of Soluble Fiber  . holds water in the colon to bulk and soften the stool . prolongs stomach emptying time so that sugar is released and absorbed more slowly        Benefits of Soluble Fiber . lowers total cholesterol and LDL cholesterol (the bad cholesterol) therefore reducing the risk of heart disease  . regulates blood sugar for people with diabetes       Food Sources of Soluble Fiber . oat/oat bran . dried beans and peas  . nuts  . barley  . flax seed or other seeds ( ground up)  . fruits such as oranges, pears, peaches, and apples  . vegetables such as carrots  . psyllium husk  . Prunes    Adding Fiber to Your Diet Eating foods that are high in fiber can help relieve some problems with constipation, hemorrhoids, diverticulitis and irritable bowel syndrome. A high-fiber diet can provide long-term health benefits.  Here are some tips to help you easily add fibers to your diet.  Start  Slowly Many people notice bloating, cramping or gas when they add fiber to their diet. Making small changes in your diet over a period of time can help prevent this. Start with one of the changes listed below, then wait several days to a week before making another. If one change doesn't seem to work for you, try a different one. You may have some gas or bloating at first, but your body will adjust in time. It's important to drink more fluids when you increase the amount of fiber you eat. If you don't already drink over 6 glasses of liquid a day, drink at least 2 more glasses of water a day when you increase your fiber intake. Tips for Increasing Fiber . Start your day with a high-fiber breakfast cereal. Check labels on the packages for the amounts of dietary fiber in each brand.  Eat at least 5 servings of fruits and vegetables each day. Fruits and vegetables that are high in fiber include: Apples Oranges Broccoli Cauliflower Berries Pears Brussels sprouts Green peas Figs Prunes Carrots Beans . Include fruits or vegetables with every meal. Use carrot sticks or apple slices for snacks.  . Cooked fiber is just as effective as raw fiber, so incorporate high-fiber foods in your cooking. . When preparing food leave edible skins and seeds, and use whole-grain flours. Marland Kitchen  Serve fruit-based desserts.  . Replace white bread with whole-grain breads and cereals. Eat brown rice instead of white rice.  . Eat more of the following foods: Bran muffins Brown rice Oatmeal Popcorn Multiple-grain cereals, cooked or dry 100% whole-wheat bread  . Add whole grains and dried beans to casseroles.  . Add 1/4 cup of wheat bran (miller's bran) to foods such as cooked cereal or applesauce or meat loaf. If you still suffer from constipation, talk to your health care provider about fiber laxatives. Psyllium is a soluble fiber that is often used for this purpose. It can be taken in tablet form or as a powder that is mixed in a  glass of water. Always read and follow the directions on the label carefully.  Talk to your doctor or pharmacist if you have any concerns about fiber.  BACK / LEG CRAMPS: Elevated Leg Stretch on Wall (FIRST TRIMESTER ONLY)    Lie on back with buttocks against wall, knees bent and soles of feet together. Then perform diaphgramatic breathing . Exhaling, bring soles of feet together, bend knees and slide feet down. For 1 min.  Copyright  VHI. All rights reserved.  [facility] [address] [place], [place] [address] Phone # [phone] Fax [phone]

## 2018-02-26 ENCOUNTER — Ambulatory Visit: Payer: Medicare Other | Attending: Family Medicine | Admitting: Physical Therapy

## 2018-02-26 ENCOUNTER — Encounter: Payer: Self-pay | Admitting: Physical Therapy

## 2018-02-26 DIAGNOSIS — M6281 Muscle weakness (generalized): Secondary | ICD-10-CM | POA: Diagnosis not present

## 2018-02-26 DIAGNOSIS — R279 Unspecified lack of coordination: Secondary | ICD-10-CM

## 2018-02-26 DIAGNOSIS — M62838 Other muscle spasm: Secondary | ICD-10-CM

## 2018-02-26 NOTE — Therapy (Signed)
[facility] [address], [address] [place], [place], [address] Phone: [phone]   Fax:  [phone]  Physical Therapy Treatment  Patient Details  Name: Laura Raymond MRN: [id number] Date of Birth: 14-Dec-1940 Referring Provider: Dr. Rockney Ghee D. Clelia Croft   Encounter Date: 02/26/2018  PT End of Session - 02/26/18 1227    Visit Number  8    Date for PT Re-Evaluation  03/04/18    Authorization Type  [employer] Medicare    PT Start Time  1145    PT Stop Time  1230    PT Time Calculation (min)  45 min    Activity Tolerance  Patient tolerated treatment well    Behavior During Therapy  WFL for tasks assessed/performed       Past Medical History:  Diagnosis Date  . Anxiety   . Goiter   . Hypertension   . IBS (irritable bowel syndrome)   . Mitral valve prolapse   . Osteoporosis   . Thyroid disease     Past Surgical History:  Procedure Laterality Date  . ABDOMINAL HYSTERECTOMY  1999  . ABLATION    . APPENDECTOMY  1962  . BREAST LUMPECTOMY Left 1968  . COLONOSCOPY  08/26/2017  . endocopsy  08/26/2017    There were no vitals filed for this visit.  Subjective Assessment - 02/26/18 1149    Subjective  I have been enjoying the exercise against the wall. I have not added the fiber yet. I plan to add it this week.     Patient Stated Goals  reduce fecal leakage    Currently in Pain?  No/denies    Multiple Pain Sites  No                    Pelvic Floor Special Questions - 02/26/18 0001    Pelvic Floor Internal Exam  Patient confirms identificaiton and approves PT to assess muscle integrity and treatment    Exam Type  Vaginal    Palpation  thickness of scar in posterior introitus from past tear during pregnancy, increased tenderness to light touch ot posterior introitus    Strength  fair squeeze, definite lift        [facility] Adult PT Treatment/Exercise - 02/26/18 0001      Manual Therapy   Manual Therapy   Internal Pelvic Floor    Internal Pelvic Floor  bil. levator ani, bil. obturator internist, introitus with myofascial release, along the scar post. introitus               PT Short Term Goals - 01/29/18 1243      PT SHORT TERM GOAL #1   Title  independent with initial HEP    Time  4    Period  Weeks    Status  Achieved      PT SHORT TERM GOAL #2   Title  understand correct bowel movement to relax the pelvic floor and push out the stools    Time  4    Period  Weeks    Status  Achieved        PT Long Term Goals - 02/26/18 1151      PT LONG TERM GOAL #1   Title  independent with HEP and understand how to progress herself    Baseline  still learning    Period  Weeks    Status  On-going      PT LONG TERM GOAL #2   Title  fecal incontinence decresaed >/= 75% and patient is aware of fecal leakage    Baseline  no episodes this week    Time  8    Period  Weeks    Status  On-going      PT LONG TERM GOAL #3   Title  increased flexibility of bilateral hips so she is able to sit on the commode with her knees higher than hips to have a proper bowel movement    Time  8    Period  Weeks    Status  Achieved      PT LONG TERM GOAL #4   Title  does not have to wear extra toilet paper tucked into the anal region due to fecal leakage    Baseline  still has too    Time  8    Period  Weeks    Status  On-going      PT LONG TERM GOAL #5   Title  pelvic floor strength >/= 3/5 with good lift to reduce pelvic leakage    Time  5    Period  Weeks    Status  On-going            Plan - 02/26/18 1232    Clinical Impression Statement  Patient is having firmer stools.  Patient has not started her fiber yet.  Patient has thickness of the scar from childbirth vaginally.  Patient reports she is not able to have intercourse for years due to pain.  Patient has tightness of the pelvic floor muscles making it difficult to contract the musclse.  Patient felt better and different in the  pelvic floor area after soft tissue work.  Vaginal strength is 3/5. Patient will benefit from skilled therapy to relax pelvic floor muscles and work on  pelvic floor muscle control.     Rehab Potential  Excellent    Clinical Impairments Affecting Rehab Potential  osteoporosis, hypothyroidism, Hysterectomy    PT Frequency  1x / week    PT Duration  8 weeks    PT Treatment/Interventions  Biofeedback;Therapeutic activities;Therapeutic exercise;Patient/family education;Neuromuscular re-education;Manual techniques;Passive range of motion;Dry needling    PT Next Visit Plan  internal work to release the muscles, renewal note to work on muscles    PT Home Exercise Plan  progress as needed    Consulted and Agree with Plan of Care  Patient       Patient will benefit from skilled therapeutic intervention in order to improve the following deficits and impairments:  Increased fascial restricitons, Decreased mobility, Increased muscle spasms, Decreased strength, Decreased range of motion, Decreased coordination  Visit Diagnosis: Muscle weakness (generalized)  Unspecified lack of coordination  Other muscle spasm     Problem List There are no active problems to display for this patient.   Eulis Foster, PT 02/26/18 12:46 PM   [facility] [address], [address] [place], [place], [address] Phone: [phone]   Fax:  [phone]  Name: Laura Raymond MRN: [id number] Date of Birth: 06-22-1941

## 2018-03-04 ENCOUNTER — Encounter: Payer: Self-pay | Admitting: Physical Therapy

## 2018-03-04 ENCOUNTER — Ambulatory Visit: Payer: Medicare Other | Admitting: Physical Therapy

## 2018-03-04 DIAGNOSIS — M62838 Other muscle spasm: Secondary | ICD-10-CM

## 2018-03-04 DIAGNOSIS — M6281 Muscle weakness (generalized): Secondary | ICD-10-CM

## 2018-03-04 DIAGNOSIS — R279 Unspecified lack of coordination: Secondary | ICD-10-CM

## 2018-03-04 NOTE — Therapy (Signed)
[facility] [address] [place], [place], [address] Phone: [phone]   Fax:  [phone]  Physical Therapy Treatment  Patient Details  Name: Laura Raymond MRN: [id number] Date of Birth: 05-31-1941 Referring Provider: Dr. Rockney Ghee D. Clelia Croft   Encounter Date: 03/04/2018  PT End of Session - 03/04/18 1152    Visit Number  9    Date for PT Re-Evaluation  04/29/18    Authorization Type  [employer]HC Medicare    PT Start Time  1145    PT Stop Time  1225    PT Time Calculation (min)  40 min    Activity Tolerance  Patient tolerated treatment well    Behavior During Therapy  WFL for tasks assessed/performed       Past Medical History:  Diagnosis Date  . Anxiety   . Goiter   . Hypertension   . IBS (irritable bowel syndrome)   . Mitral valve prolapse   . Osteoporosis   . Thyroid disease     Past Surgical History:  Procedure Laterality Date  . ABDOMINAL HYSTERECTOMY  1999  . ABLATION    . APPENDECTOMY  1962  . BREAST LUMPECTOMY Left 1968  . COLONOSCOPY  08/26/2017  . endocopsy  08/26/2017    There were no vitals filed for this visit.  Subjective Assessment - 03/04/18 1156    Subjective  I lifted heavy boxes and the straining did cause a fecal leakage. Patient was able to hold back a fecal leakage for first time.     Patient Stated Goals  reduce fecal leakage    Currently in Pain?  No/denies         [facility] PT Assessment - 03/04/18 0001      Assessment   Medical Diagnosis  R15.9 Fecal incontinence    Referring Provider  Dr. Rockney Ghee D. Shaw    Onset Date/Surgical Date  07/22/18    Prior Therapy  None      Precautions   Precautions  Other (comment)    Precaution Comments  osteoporosis      Restrictions   Weight Bearing Restrictions  No      Home Environment   Living Environment  Private residence      Prior Function   Level of Independence  Independent    Vocation  Retired    Leisure  no  exercise regularly      Cognition   Overall Cognitive Status  Within Functional Limits for tasks assessed      Observation/Other Assessments   Focus on Therapeutic Outcomes (FOTO)   patient is limited by 30% due to fecal leakage      Posture/Postural Control   Posture/Postural Control  Postural limitations    Postural Limitations  Rounded Shoulders;Forward head;Increased thoracic kyphosis      AROM   Overall AROM Comments  lumbar ROM is full      PROM   Right Hip External Rotation   35    Left Hip Extension  10    Left Hip Flexion  120    Left Hip External Rotation   50    Left Hip ABduction  20      Strength   Right Hip Extension  4/5    Right Hip ABduction  4/5    Left Hip Extension  4/5    Left Hip ABduction  4/5      Right Hip   Right Hip Extension  10    Right Hip  Flexion  125    Right Hip ABduction  20      Transfers   Transfers  Not assessed      Ambulation/Gait   Ambulation/Gait  No                Pelvic Floor Special Questions - 03/04/18 0001    Pelvic Floor Internal Exam  Patient confirms identificaiton and approves PT to assess muscle integrity and treatment    Exam Type  Vaginal    Strength  fair squeeze, definite lift        [facility] Adult PT Treatment/Exercise - 03/04/18 0001      Neuro Re-ed    Neuro Re-ed Details   pelvic floor contraction with tacile cues to draw muscles upward hold 5 seconds 15 x      Manual Therapy   Manual Therapy  Internal Pelvic Floor    Internal Pelvic Floor  bil. levator ani, bil. obturator internist, introitus with myofascial release, along the scar post. introitus               PT Short Term Goals - 01/29/18 1243      PT SHORT TERM GOAL #1   Title  independent with initial HEP    Time  4    Period  Weeks    Status  Achieved      PT SHORT TERM GOAL #2   Title  understand correct bowel movement to relax the pelvic floor and push out the stools    Time  4    Period  Weeks    Status  Achieved         PT Long Term Goals - 03/04/18 1153      PT LONG TERM GOAL #2   Title  fecal incontinence decresaed >/= 75% and patient is aware of fecal leakage    Baseline  able to control no leakage with a time she felt she was going to have and episode    Time  8    Period  Weeks    Status  On-going      PT LONG TERM GOAL #4   Title  does not have to wear extra toilet paper tucked into the anal region due to fecal leakage    Baseline  still has too    Time  8    Period  Weeks    Status  On-going            Plan - 03/04/18 1152    Clinical Impression Statement  Patient is now able to feel the contraction of the anus to avoid fecal leakage.  Patient continues to have thickness along the left side of the rectum, midline where she has a scar from tear during child birth.  Patient is now able to isolate the pelvic floor contraction.  Patient has increased bilateral hip PROM.  Bilateral hip strength 4/5. Patient understands how to have a bowel movement with squatty potty.  Patient continues to wear extra toilet paper in the anal area for fecal leakage.  Patient will benefit from skilled therapy to reduce fecal leakage and improve pelvic floor contraction.     Rehab Potential  Excellent    Clinical Impairments Affecting Rehab Potential  osteoporosis, hypothyroidism, Hysterectomy    PT Frequency  1x / week    PT Duration  8 weeks    PT Treatment/Interventions  Biofeedback;Therapeutic activities;Therapeutic exercise;Patient/family education;Neuromuscular re-education;Manual techniques;Passive range of motion;Dry needling    PT Next Visit Plan  pelvic floor EMG to work on strength    PT Home Exercise Plan  progress as needed    Recommended Other Services  sent MD renewal note on 03/04/2018    Consulted and Agree with Plan of Care  Patient       Patient will benefit from skilled therapeutic intervention in order to improve the following deficits and impairments:  Increased fascial  restricitons, Decreased mobility, Increased muscle spasms, Decreased strength, Decreased range of motion, Decreased coordination  Visit Diagnosis: Muscle weakness (generalized) - Plan: PT plan of care cert/re-cert  Unspecified lack of coordination - Plan: PT plan of care cert/re-cert  Other muscle spasm - Plan: PT plan of care cert/re-cert     Problem List There are no active problems to display for this patient.   Eulis Foster, PT 03/04/18 12:33 PM   [facility] [address] [place], [place], [address] Phone: [phone]   Fax:  [phone]  Name: Laura Raymond MRN: [id number] Date of Birth: 16-Aug-1941

## 2018-03-19 ENCOUNTER — Ambulatory Visit: Payer: Medicare Other | Admitting: Physical Therapy

## 2018-03-19 ENCOUNTER — Encounter: Payer: Self-pay | Admitting: Physical Therapy

## 2018-03-19 DIAGNOSIS — M6281 Muscle weakness (generalized): Secondary | ICD-10-CM

## 2018-03-19 DIAGNOSIS — M62838 Other muscle spasm: Secondary | ICD-10-CM

## 2018-03-19 DIAGNOSIS — R279 Unspecified lack of coordination: Secondary | ICD-10-CM

## 2018-03-19 NOTE — Therapy (Signed)
[facility] [address], [address] [place], [place], [address] Phone: [phone]   Fax:  [phone]  Progress Note Reporting Period 01/07/2018 to 03/19/2018  See note below for Objective Data and Assessment of Progress/Goals.       Physical Therapy Treatment  Patient Details  Name: Laura Raymond MRN: [id number] Date of Birth: July 18, 1941 Referring Provider: Dr. Rockney Ghee D. Clelia Croft   Encounter Date: 03/19/2018  PT End of Session - 03/19/18 1152    Visit Number  10    Date for PT Re-Evaluation  04/29/18    Authorization Type  [employer]HC Medicare    PT Start Time  1145    PT Stop Time  1225    PT Time Calculation (min)  40 min    Activity Tolerance  Patient tolerated treatment well    Behavior During Therapy  WFL for tasks assessed/performed       Past Medical History:  Diagnosis Date  . Anxiety   . Goiter   . Hypertension   . IBS (irritable bowel syndrome)   . Mitral valve prolapse   . Osteoporosis   . Thyroid disease     Past Surgical History:  Procedure Laterality Date  . ABDOMINAL HYSTERECTOMY  1999  . ABLATION    . APPENDECTOMY  1962  . BREAST LUMPECTOMY Left 1968  . COLONOSCOPY  08/26/2017  . endocopsy  08/26/2017    There were no vitals filed for this visit.  Subjective Assessment - 03/19/18 1151    Subjective  I had one bad incidence when I ate firecracker shrimp.  I have more control of my pelvic floor and I am always doing the contraction.  I am able to isolate the contraction without squeezing my buttocks. Has trouble cleaning the anal reagion after a bowel movement.     Patient Stated Goals  reduce fecal leakage    Currently in Pain?  No/denies    Multiple Pain Sites  No         [facility] PT Assessment - 03/19/18 0001      Assessment   Medical Diagnosis  R15.9 Fecal incontinence    Referring Provider  Dr. Rockney Ghee D. Shaw    Onset Date/Surgical Date  07/22/18    Prior Therapy  None      Precautions   Precautions  Other (comment)    Precaution Comments  osteoporosis      Restrictions   Weight Bearing Restrictions  No      Balance Screen   Has the patient fallen in the past 6 months  No    Has the patient had a decrease in activity level because of a fear of falling?   No    Is the patient reluctant to leave their home because of a fear of falling?   No      Home Public house manager residence      Prior Function   Level of Independence  Independent    Vocation  Retired    Leisure  no exercise regularly      Cognition   Overall Cognitive Status  Within Functional Limits for tasks assessed      Observation/Other Assessments   Focus on Therapeutic Outcomes (FOTO)   patient is limited by 30% due to fecal leakage      Posture/Postural Control   Posture/Postural Control  Postural limitations    Postural Limitations  Rounded Shoulders;Forward head;Increased thoracic kyphosis  AROM   Overall AROM Comments  lumbar ROM is full      Strength   Right Hip Extension  5/5    Right Hip ABduction  4+/5    Left Hip Extension  4/5    Left Hip ABduction  4+/5                   [facility] Adult PT Treatment/Exercise - 03/19/18 0001      Neuro Re-ed    Neuro Re-ed Details   breathing to expand the abdomen then breath out to fully contract the lower abdominals with verbal and tactile cues      Manual Therapy   Manual Therapy  Soft tissue mobilization    Soft tissue mobilization  transverse abdominus muscle             PT Education - 03/19/18 1227    Education provided  Yes    Education Details  pelvic floor contraction with ball squeeze and hip rotators    Person(s) Educated  Patient    Methods  Explanation;Demonstration;Verbal cues;Handout    Comprehension  Verbalized understanding;Returned demonstration       PT Short Term Goals - 01/29/18 1243      PT SHORT TERM GOAL #1   Title  independent with initial HEP    Time  4     Period  Weeks    Status  Achieved      PT SHORT TERM GOAL #2   Title  understand correct bowel movement to relax the pelvic floor and push out the stools    Time  4    Period  Weeks    Status  Achieved        PT Long Term Goals - 03/19/18 1153      PT LONG TERM GOAL #1   Title  independent with HEP and understand how to progress herself    Baseline  still learning    Time  8    Period  Weeks    Status  On-going      PT LONG TERM GOAL #2   Title  fecal incontinence decresaed >/= 75% and patient is aware of fecal leakage    Baseline  1 time in 2 weeks with diarrhea    Time  8    Period  Weeks    Status  On-going      PT LONG TERM GOAL #3   Title  increased flexibility of bilateral hips so she is able to sit on the commode with her knees higher than hips to have a proper bowel movement    Time  8    Period  Weeks    Status  Achieved      PT LONG TERM GOAL #4   Title  does not have to wear extra toilet paper tucked into the anal region due to fecal leakage    Baseline  now is a habit; use just in case    Time  8    Period  Weeks    Status  On-going            Plan - 03/19/18 1228    Clinical Impression Statement  Patient reports she has more of a sense of fecal leakage. Leakage with diarrhea is 50% better and with hard stools is 75% better.  Patient is able to contract her lower abdominals with pelvic floor contraction for first time with no verbal cues. Bilateral hip strength has increased.  Patient  is wearing a piece of toilet paper  in the anal reagion for just incase. Patient is able to progress her pelvic floor exercises in sitting.  Patient will need verbal cues ot not contract her buttocks.  Patient is able to perform siaphragmatic breathing without cues. Patient will benefit from skilled therapy to reduce fecal leakage and improve pelvic floor contraction.     Rehab Potential  Excellent    Clinical Impairments Affecting Rehab Potential  osteoporosis,  hypothyroidism, Hysterectomy    PT Frequency  1x / week    PT Duration  8 weeks    PT Treatment/Interventions  Biofeedback;Therapeutic activities;Therapeutic exercise;Patient/family education;Neuromuscular re-education;Manual techniques;Passive range of motion;Dry needling    PT Next Visit Plan  pelvic floor EMG to work on strength    PT Home Exercise Plan  progress as needed    Recommended Other Services  MD signed the renewal    Consulted and Agree with Plan of Care  Patient       Patient will benefit from skilled therapeutic intervention in order to improve the following deficits and impairments:  Increased fascial restricitons, Decreased mobility, Increased muscle spasms, Decreased strength, Decreased range of motion, Decreased coordination  Visit Diagnosis: Unspecified lack of coordination  Muscle weakness (generalized)  Other muscle spasm     Problem List There are no active problems to display for this patient.   Eulis Foster, PT 03/19/18 12:50 PM   [facility] [address], [address] [place], [place], [address] Phone: [phone]   Fax:  [phone]  Name: Laura Raymond MRN: [id number] Date of Birth: 11-02-1940

## 2018-03-19 NOTE — Patient Instructions (Addendum)
Adduction: Hip - Knees Together With Pelvic Floor (Sitting)    Sit with towel roll between knees. Squeeze pelvic floor while pushing knees together. Hold for __5_ seconds. Rest for __5_ seconds. Repeat _10__ times. Do _2__ times a day.  Copyright  VHI. All rights reserved.  External Rotation: Hip - Knees Apart With Pelvic Floor (Sitting)    Sit, band tied just above knees. Squeeze pelvic floor while pulling knees apart. Hold for _5__ seconds. Rest for _5__ seconds. Repeat _10__ times. Do _2__ times a day.  Copyright  VHI. All rights reserved.  [facility] [address], [address] [place], [place] [address] Phone # [phone] Fax [phone]  

## 2018-04-02 ENCOUNTER — Ambulatory Visit: Payer: Medicare Other | Attending: Family Medicine | Admitting: Physical Therapy

## 2018-04-02 ENCOUNTER — Encounter: Payer: Self-pay | Admitting: Physical Therapy

## 2018-04-02 DIAGNOSIS — R279 Unspecified lack of coordination: Secondary | ICD-10-CM | POA: Insufficient documentation

## 2018-04-02 DIAGNOSIS — M62838 Other muscle spasm: Secondary | ICD-10-CM | POA: Insufficient documentation

## 2018-04-02 DIAGNOSIS — M6281 Muscle weakness (generalized): Secondary | ICD-10-CM | POA: Diagnosis present

## 2018-04-02 NOTE — Patient Instructions (Addendum)
Quick Contraction: Gravity Resisted (Sitting)    Sitting, quickly squeeze then fully relax pelvic floor. Perform __1_ sets of _5__. Rest for __5_ seconds between sets. Do _5__ times a day.  Copyright  VHI. All rights reserved.  Slow Contraction: Gravity Resisted (Sitting)    Sitting, slowly squeeze pelvic floor for _5__ seconds. Rest for _20__ seconds. Repeat _10__ times. Do __3_ times a day.  Copyright  VHI. All rights reserved.  [facility] [address], [address] [place], [place] [address] Phone # [phone] Fax [phone]

## 2018-04-02 NOTE — Therapy (Signed)
[facility] [address] [place], [place], [address] Phone: [phone]   Fax:  [phone]  Physical Therapy Treatment  Patient Details  Name: Laura Raymond MRN: [id number] Date of Birth: 20-Jan-1941 Referring Provider: Dr. Rockney Ghee D. Clelia Croft   Encounter Date: 04/02/2018  PT End of Session - 04/02/18 1151    Visit Number  11    Date for PT Re-Evaluation  04/29/18    Authorization Type  [employer] Medicare    PT Start Time  1145    PT Stop Time  1225    PT Time Calculation (min)  40 min    Activity Tolerance  Patient tolerated treatment well    Behavior During Therapy  WFL for tasks assessed/performed       Past Medical History:  Diagnosis Date  . Anxiety   . Goiter   . Hypertension   . IBS (irritable bowel syndrome)   . Mitral valve prolapse   . Osteoporosis   . Thyroid disease     Past Surgical History:  Procedure Laterality Date  . ABDOMINAL HYSTERECTOMY  1999  . ABLATION    . APPENDECTOMY  1962  . BREAST LUMPECTOMY Left 1968  . COLONOSCOPY  08/26/2017  . endocopsy  08/26/2017    There were no vitals filed for this visit.  Subjective Assessment - 04/02/18 1148    Subjective  I had a cooked tomatoe and gave me an episode with my bowels. I am more regular if I get the urge when I wake up.     Patient Stated Goals  reduce fecal leakage    Currently in Pain?  No/denies                    Pelvic Floor Special Questions - 04/02/18 0001    Biofeedback  sitting- resting tone 2 uv, quick contraction needed VC to not contract the gluteal, after several contractions able to isolate quick contraction to 12-16 uv; hold above 5 uv for 5 seconds and rest 20 seconds, work on no spike prior to initial part of contraction; pelvic floor contraction going from sit to stand, holding items with pelvic floor contraction    Biofeedback sensor type  Surface rectal        [facility] Adult PT Treatment/Exercise -  04/02/18 0001      Self-Care   Self-Care  Other Self-Care Comments    Other Self-Care Comments   education on low FODMAP diet to reduce  bowel issues and episodes             PT Education - 04/02/18 1225    Education provided  Yes    Education Details  pelvic floor contraction in sitting without contracting the gluteals    Person(s) Educated  Patient    Methods  Explanation;Demonstration;Verbal cues;Handout    Comprehension  Returned demonstration;Verbalized understanding       PT Short Term Goals - 01/29/18 1243      PT SHORT TERM GOAL #1   Title  independent with initial HEP    Time  4    Period  Weeks    Status  Achieved      PT SHORT TERM GOAL #2   Title  understand correct bowel movement to relax the pelvic floor and push out the stools    Time  4    Period  Weeks    Status  Achieved        PT Long Term Goals -  04/02/18 1228      PT LONG TERM GOAL #1   Title  independent with HEP and understand how to progress herself    Baseline  still learning    Time  8    Period  Weeks    Status  On-going      PT LONG TERM GOAL #2   Title  fecal incontinence decresaed >/= 75% and patient is aware of fecal leakage    Baseline  1 time in 2 weeks with diarrhea    Time  8    Period  Weeks    Status  On-going      PT LONG TERM GOAL #4   Title  does not have to wear extra toilet paper tucked into the anal region due to fecal leakage    Baseline  now is a habit; use just in case    Time  8    Period  Weeks    Status  On-going      PT LONG TERM GOAL #5   Title  pelvic floor strength >/= 3/5 with good lift to reduce pelvic leakage    Time  5    Period  Weeks    Status  On-going            Plan - 04/02/18 1151    Clinical Impression Statement  Patient resting level in sitting is 2 uv. Patient is able to do a quick flick without gluteals but needs several tries and verbal cues first. Patient has difficulty with holding the pelvic floor contraction in sitting  due to fatique and difficulty with relaxing to her resting level.  Patient has leakage when she eats certain foods so she will try the FODMAP diet to see if this will help.  Patient will benefit from skilled therapy to reduce fecal leakge and improve pelvic floor contraction.     Rehab Potential  Excellent    Clinical Impairments Affecting Rehab Potential  osteoporosis, hypothyroidism, Hysterectomy    PT Frequency  1x / week    PT Duration  8 weeks    PT Treatment/Interventions  Biofeedback;Therapeutic activities;Therapeutic exercise;Patient/family education;Neuromuscular re-education;Manual techniques;Passive range of motion;Dry needling    PT Next Visit Plan  pelvic floor EMG to work on strength sitting, lifting, walking and turning    PT Home Exercise Plan  progress as needed    Consulted and Agree with Plan of Care  Patient       Patient will benefit from skilled therapeutic intervention in order to improve the following deficits and impairments:  Increased fascial restricitons, Decreased mobility, Increased muscle spasms, Decreased strength, Decreased range of motion, Decreased coordination  Visit Diagnosis: Unspecified lack of coordination  Muscle weakness (generalized)  Other muscle spasm     Problem List There are no active problems to display for this patient.   Eulis FosterCheryl Gray, PT 04/02/18 12:30 PM   [facility] [address] [place], [place], [address] Phone: [phone]   Fax:  [phone]  Name: Laura Raymond MRN: [id number] Date of Birth: Apr 11, 1941

## 2018-04-16 ENCOUNTER — Ambulatory Visit: Payer: Medicare Other | Admitting: Physical Therapy

## 2018-04-16 ENCOUNTER — Encounter: Payer: Self-pay | Admitting: Physical Therapy

## 2018-04-16 DIAGNOSIS — M6281 Muscle weakness (generalized): Secondary | ICD-10-CM

## 2018-04-16 DIAGNOSIS — R279 Unspecified lack of coordination: Secondary | ICD-10-CM

## 2018-04-16 DIAGNOSIS — M62838 Other muscle spasm: Secondary | ICD-10-CM

## 2018-04-16 NOTE — Therapy (Signed)
[facility] [address] [place], [place], [address] Phone: [phone]   Fax:  [phone]  Physical Therapy Treatment  Patient Details  Name: Laura Raymond MRN: [id number] Date of Birth: [birth year] Referring Provider: Dr. Rockney Ghee D. Clelia Croft   Encounter Date: 04/16/2018  PT End of Session - 04/16/18 1152    Visit Number  12    Date for PT Re-Evaluation  06/24/18    Authorization Type  [employer]HC Medicare    PT Start Time  1145    PT Stop Time  1225    PT Time Calculation (min)  40 min    Activity Tolerance  Patient tolerated treatment well    Behavior During Therapy  WFL for tasks assessed/performed       Past Medical History:  Diagnosis Date  . Anxiety   . Goiter   . Hypertension   . IBS (irritable bowel syndrome)   . Mitral valve prolapse   . Osteoporosis   . Thyroid disease     Past Surgical History:  Procedure Laterality Date  . ABDOMINAL HYSTERECTOMY  1999  . ABLATION    . APPENDECTOMY  1962  . BREAST LUMPECTOMY Left 1968  . COLONOSCOPY  08/26/2017  . endocopsy  08/26/2017    There were no vitals filed for this visit.  Subjective Assessment - 04/16/18 1150    Subjective  Day to day I have improved.  I mostly go in the morning. I went 5 times this morning. The toileting technique has helped.  I only had one incidence to leakage.  the pelvic floor contraction I do a lot during the day.     Patient Stated Goals  reduce fecal leakage    Currently in Pain?  No/denies    Multiple Pain Sites  No         [facility] PT Assessment - 04/16/18 0001      Assessment   Medical Diagnosis  R15.9 Fecal incontinence    Referring Provider  Dr. Rockney Ghee D. Shaw    Onset Date/Surgical Date  07/22/18    Prior Therapy  None      Precautions   Precautions  Other (comment)    Precaution Comments  osteoporosis      Restrictions   Weight Bearing Restrictions  No      Home Environment   Living Environment   Private residence      Prior Function   Level of Independence  Independent    Vocation  Retired    Leisure  no exercise regularly      Cognition   Overall Cognitive Status  Within Functional Limits for tasks assessed      Observation/Other Assessments   Focus on Therapeutic Outcomes (FOTO)   patient is limited by 30% due to fecal leakage      AROM   Overall AROM Comments  lumbar ROM is full      Strength   Right Hip Extension  5/5    Right Hip ABduction  4+/5    Left Hip Extension  4/5    Left Hip ABduction  4+/5                Pelvic Floor Special Questions - 04/16/18 0001    Fecal incontinence  Yes    Strength  fair squeeze, definite lift    Biofeedback  quick flicks 24 uv; hold 5 sec just above 7 uv for 5 sec for 15 min till she was abl eto  demonstrate control; standing exercise hold 10 sec. then relax to 3 uv;     Biofeedback sensor type  Surface rectal                PT Education - 04/16/18 1229    Education provided  Yes    Education Details  pelvic floor contraction in sitting and standing    Person(s) Educated  Patient    Methods  Explanation;Demonstration;Verbal cues;Handout    Comprehension  Returned demonstration;Verbalized understanding       PT Short Term Goals - 01/29/18 1243      PT SHORT TERM GOAL #1   Title  independent with initial HEP    Time  4    Period  Weeks    Status  Achieved      PT SHORT TERM GOAL #2   Title  understand correct bowel movement to relax the pelvic floor and push out the stools    Time  4    Period  Weeks    Status  Achieved        PT Long Term Goals - 04/16/18 1153      PT LONG TERM GOAL #1   Title  independent with HEP and understand how to progress herself    Baseline  still learning    Time  8    Period  Weeks    Status  On-going      PT LONG TERM GOAL #2   Title  fecal incontinence decresaed >/= 75% and patient is aware of fecal leakage    Baseline  1 time in 2 weeks with diarrhea     Time  8    Period  Weeks    Status  On-going      PT LONG TERM GOAL #3   Title  increased flexibility of bilateral hips so she is able to sit on the commode with her knees higher than hips to have a proper bowel movement    Time  8    Period  Weeks    Status  Achieved      PT LONG TERM GOAL #4   Title  does not have to wear extra toilet paper tucked into the anal region due to fecal leakage    Baseline  now is a habit; use just in case    Time  8    Period  Weeks    Status  On-going      PT LONG TERM GOAL #5   Title  pelvic floor strength >/= 3/5 with good lift to reduce pelvic leakage    Time  5    Period  Weeks    Status  On-going            Plan - 04/16/18 1154    Clinical Impression Statement  Patient only had one stool leakage since she saw me 2 weeks ago.  Patient is able to hold her pelvic floor contraction above 6 uv in sittng and standing for 10 seconds without peaking.  Patient needs to take awhile to relax her pelvic floor after contraction with breath.  Patient had leakage prior to putting the EMG electrodes on and none when they are removed.  Patient will benefit from skilled therapy to reduce fecal leakage and improve pelvic floor contraction.     Rehab Potential  Excellent    Clinical Impairments Affecting Rehab Potential  osteoporosis, hypothyroidism, Hysterectomy    PT Frequency  1x / week    PT  Duration  8 weeks    PT Treatment/Interventions  Biofeedback;Therapeutic activities;Therapeutic exercise;Patient/family education;Neuromuscular re-education;Manual techniques;Passive range of motion;Dry needling    PT Next Visit Plan  pelvic floor EMG to work on strength sitting, lifting, walking and turning    PT Home Exercise Plan  progress as needed    Consulted and Agree with Plan of Care  Patient       Patient will benefit from skilled therapeutic intervention in order to improve the following deficits and impairments:  Increased fascial restricitons, Decreased  mobility, Increased muscle spasms, Decreased strength, Decreased range of motion, Decreased coordination  Visit Diagnosis: Unspecified lack of coordination  Muscle weakness (generalized)  Other muscle spasm     Problem List There are no active problems to display for this patient.   Eulis Foster, PT 04/16/18 2:59 PM   [facility] [address] [place], [place], [address] Phone: [phone]   Fax:  [phone]  Name: ILAMAE GENG MRN: [id number] Date of Birth: 1941-01-21

## 2018-04-16 NOTE — Patient Instructions (Addendum)
Quick Contraction: Gravity Resisted (Sitting)    Sitting, quickly squeeze then fully relax pelvic floor. Perform __1_ sets of __5_. Rest for _1__ seconds between sets. Do _2__ times a day.  Copyright  VHI. All rights reserved.  Slow Contraction: Gravity Resisted (Sitting)    Sitting, slowly squeeze pelvic floor for _10__ seconds. Rest for __20_ seconds. Repeat _5__ times. Do _2__ times a day.  Copyright  VHI. All rights reserved.  Quick Contraction: Gravity Resisted (Standing)    Standing, quickly squeeze then fully relax pelvic floor. Perform _1__ sets of __5_. Rest for _1__ seconds between sets. Do __2_ times a day.  Copyright  VHI. All rights reserved.  Slow Contraction: Gravity Resisted (Standing)    Standing, slowly squeeze pelvic floor for _10__ seconds. Rest for _20__ seconds. Repeat _5__ times. Do __2_ times a day.  Copyright  VHI. All rights reserved.  [facility] [address], [address] [place], [place] [address] Phone # [phone] Fax [phone]

## 2018-04-30 ENCOUNTER — Ambulatory Visit: Payer: Medicare Other | Attending: Family Medicine | Admitting: Physical Therapy

## 2018-04-30 ENCOUNTER — Encounter: Payer: Self-pay | Admitting: Physical Therapy

## 2018-04-30 DIAGNOSIS — R279 Unspecified lack of coordination: Secondary | ICD-10-CM | POA: Insufficient documentation

## 2018-04-30 DIAGNOSIS — M62838 Other muscle spasm: Secondary | ICD-10-CM | POA: Diagnosis present

## 2018-04-30 DIAGNOSIS — M6281 Muscle weakness (generalized): Secondary | ICD-10-CM | POA: Insufficient documentation

## 2018-04-30 NOTE — Therapy (Signed)
[facility] [address] [place], [place], [address] Phone: [phone]   Fax:  [phone]  Physical Therapy Treatment  Patient Details  Name: Laura Raymond MRN: [id number] Date of Birth: 21-May-1941 Referring Provider: Dr. Rockney Ghee D. Clelia Croft   Encounter Date: 04/30/2018  PT End of Session - 04/30/18 1150    Visit Number  13    Date for PT Re-Evaluation  06/24/18    Authorization Type  [employer]HC Medicare    PT Start Time  1145    PT Stop Time  1225    PT Time Calculation (min)  40 min    Activity Tolerance  Patient tolerated treatment well    Behavior During Therapy  WFL for tasks assessed/performed       Past Medical History:  Diagnosis Date  . Anxiety   . Goiter   . Hypertension   . IBS (irritable bowel syndrome)   . Mitral valve prolapse   . Osteoporosis   . Thyroid disease     Past Surgical History:  Procedure Laterality Date  . ABDOMINAL HYSTERECTOMY  1999  . ABLATION    . APPENDECTOMY  1962  . BREAST LUMPECTOMY Left 1968  . COLONOSCOPY  08/26/2017  . endocopsy  08/26/2017    There were no vitals filed for this visit.  Subjective Assessment - 04/30/18 1152    Subjective  I am doing well.  When I remember to tuck up it works. when I do not pull the pelvic floor up then I get the leakage. The toileting technique has really helped.     Patient Stated Goals  reduce fecal leakage    Currently in Pain?  No/denies    Multiple Pain Sites  No                    Pelvic Floor Special Questions - 04/30/18 0001    Biofeedback  sitting quick flick is 6 uv and can quickly relax; sitting contract to 6 uv then dip and then re-engare for 5 seconds ; pelvic floor contraction with walking    Biofeedback sensor type  Surface rectal    Biofeedback Activity  Quick contraction 5 sec, 2 quicks and 1 for 5 sec sitting; squat                   PT Short Term Goals - 01/29/18 1243      PT  SHORT TERM GOAL #1   Title  independent with initial HEP    Time  4    Period  Weeks    Status  Achieved      PT SHORT TERM GOAL #2   Title  understand correct bowel movement to relax the pelvic floor and push out the stools    Time  4    Period  Weeks    Status  Achieved        PT Long Term Goals - 04/30/18 1156      PT LONG TERM GOAL #2   Title  fecal incontinence decresaed >/= 75% and patient is aware of fecal leakage    Baseline  Fecal leakage is 80% better.     Time  8    Period  Weeks    Status  Achieved      PT LONG TERM GOAL #3   Title  increased flexibility of bilateral hips so she is able to sit on the commode with her knees higher than hips to  have a proper bowel movement    Time  8    Period  Weeks    Status  Achieved      PT LONG TERM GOAL #4   Title  does not have to wear extra toilet paper tucked into the anal region due to fecal leakage    Baseline  now is a habit; use just in case    Time  8    Period  Weeks    Status  On-going            Plan - 04/30/18 1151    Clinical Impression Statement  Contract to 6 uv with walking. Squat with pelvic floor contraction to 4 uv. Resting tone of pelvic floor is 2 uv.  Contract 5 seconds  in sitting with contraction above 6 uv and can quickly relax.  Patient is able to quickly contract to 8 uv in sitting and quickly relax. Patient is showing more control with pelvic floor in sitting. Patient will benefit from  skilled therapy to reduc efecal leakage and improve pelvic floor contraction.     Rehab Potential  Excellent    Clinical Impairments Affecting Rehab Potential  osteoporosis, hypothyroidism, Hysterectomy    PT Frequency  1x / week    PT Duration  8 weeks    PT Treatment/Interventions  Biofeedback;Therapeutic activities;Therapeutic exercise;Patient/family education;Neuromuscular re-education;Manual techniques;Passive range of motion;Dry needling    PT Next Visit Plan  pelvic floor EMG to work on strength  sitting, lifting, walking and turning    PT Home Exercise Plan  progress as needed    Consulted and Agree with Plan of Care  Patient       Patient will benefit from skilled therapeutic intervention in order to improve the following deficits and impairments:  Increased fascial restricitons, Decreased mobility, Increased muscle spasms, Decreased strength, Decreased range of motion, Decreased coordination  Visit Diagnosis: Unspecified lack of coordination  Muscle weakness (generalized)  Other muscle spasm     Problem List There are no active problems to display for this patient.   Eulis FosterCheryl Naveyah Iacovelli, PT 04/30/18 12:26 PM   [facility] [address] [place], [place], [address] Phone: [phone]   Fax:  [phone]  Name: Karmen StabsBarbara A Getman MRN: [id number] Date of Birth: 1941/01/27

## 2018-05-21 ENCOUNTER — Ambulatory Visit: Payer: Medicare Other | Admitting: Physical Therapy

## 2018-05-21 ENCOUNTER — Encounter: Payer: Self-pay | Admitting: Physical Therapy

## 2018-05-21 DIAGNOSIS — M62838 Other muscle spasm: Secondary | ICD-10-CM

## 2018-05-21 DIAGNOSIS — R279 Unspecified lack of coordination: Secondary | ICD-10-CM

## 2018-05-21 DIAGNOSIS — M6281 Muscle weakness (generalized): Secondary | ICD-10-CM

## 2018-05-21 NOTE — Therapy (Signed)
[facility] [address], [place], [place], [address] Phone: [phone]   Fax:  [phone]  Physical Therapy Treatment  Patient Details  Name: Laura Raymond MRN: [id number] Date of Birth: Mar 05, 1941 Referring Provider: Dr. Nathen May D. Brigitte Pulse   Encounter Date: 05/21/2018  PT End of Session - 05/21/18 1149    Visit Number  14    Date for PT Re-Evaluation  06/24/18    Authorization Type  [employer] Medicare    PT Start Time  8270    PT Stop Time  1225    PT Time Calculation (min)  40 min    Activity Tolerance  Patient tolerated treatment well    Behavior During Therapy  WFL for tasks assessed/performed       Past Medical History:  Diagnosis Date  . Anxiety   . Goiter   . Hypertension   . IBS (irritable bowel syndrome)   . Mitral valve prolapse   . Osteoporosis   . Thyroid disease     Past Surgical History:  Procedure Laterality Date  . ABDOMINAL HYSTERECTOMY  1999  . ABLATION    . APPENDECTOMY  1962  . BREAST LUMPECTOMY Left 1968  . COLONOSCOPY  08/26/2017  . endocopsy  08/26/2017    There were no vitals filed for this visit.  Subjective Assessment - 05/21/18 1147    Subjective  I have been doing very well. I had a little leakage on the toilet paper and not any on my underwear. I have been mindful of tighten the pelvic floor.  I am wlaking with pelvic floor contraction and when I move.     Patient Stated Goals  reduce fecal leakage    Currently in Pain?  No/denies         [facility] PT Assessment - 05/21/18 0001      Assessment   Medical Diagnosis  R15.9 Fecal incontinence    Referring Provider  Dr. Nathen May D. Shaw    Onset Date/Surgical Date  07/22/18    Prior Therapy  None      Precautions   Precautions  Other (comment)    Precaution Comments  osteoporosis      Restrictions   Weight Bearing Restrictions  No      Prior Function   Level of Independence  Independent    Vocation  Retired     Leisure  no exercise regularly      Cognition   Overall Cognitive Status  Within Functional Limits for tasks assessed      Observation/Other Assessments   Focus on Therapeutic Outcomes (FOTO)   patient limited by 5% due to small leakage one time on toilet paper      Posture/Postural Control   Posture/Postural Control  No significant limitations      AROM   Overall AROM Comments  lumbar ROM is full      PROM   Right Hip External Rotation   40    Left Hip Extension  10    Left Hip Flexion  120    Left Hip External Rotation   50    Left Hip ABduction  20      Strength   Right Hip Extension  5/5    Right Hip ABduction  5/5    Left Hip Extension  5/5    Left Hip ABduction  5/5      Right Hip   Right Hip Extension  10    Right Hip Flexion  125    Right Hip ABduction  20      Transfers   Transfers  Independent with all Transfers      Ambulation/Gait   Ambulation/Gait  No                Pelvic Floor Special Questions - 05/21/18 0001    Urinary Leakage  Yes    Activities that cause leaking  Other    Other activities that cause leaking  when stands up in the morning to go to the bathroom and 1-2 drops of urine comes out    Fecal incontinence  Yes 1 time some came out on toitet paper    Pelvic Floor Internal Exam  Patient confirms identificaiton and approves PT to assess muscle integrity and treatment    Exam Type  Rectal    Strength  good squeeze, good lift, able to hold agaisnt strong resistance    Strength # of reps  5    Strength # of seconds  10        [facility] Adult PT Treatment/Exercise - 05/21/18 0001      Neuro Re-ed    Neuro Re-ed Details   sitting quick flicks , 10 second contractions 10x;  sit squeeze ball with pelvic floor contraction; sit with hip rotation against red band with pelvic floor contraction; sit to stand with pelvic floro onctraction and siting with control; walking with pelvic floor onctration             PT Education - 05/21/18  1223    Education provided  Yes    Education Details  pelvic floor exercise program    Person(s) Educated  Patient    Methods  Explanation;Demonstration;Handout    Comprehension  Returned demonstration;Verbalized understanding       PT Short Term Goals - 01/29/18 1243      PT SHORT TERM GOAL #1   Title  independent with initial HEP    Time  4    Period  Weeks    Status  Achieved      PT SHORT TERM GOAL #2   Title  understand correct bowel movement to relax the pelvic floor and push out the stools    Time  4    Period  Weeks    Status  Achieved        PT Long Term Goals - 05/21/18 1152      PT LONG TERM GOAL #1   Title  independent with HEP and understand how to progress herself    Time  8    Period  Weeks    Status  Achieved      PT LONG TERM GOAL #2   Title  fecal incontinence decresaed >/= 75% and patient is aware of fecal leakage    Time  8    Period  Weeks    Status  Achieved      PT LONG TERM GOAL #3   Title  increased flexibility of bilateral hips so she is able to sit on the commode with her knees higher than hips to have a proper bowel movement    Time  8    Period  Weeks    Status  Achieved      PT LONG TERM GOAL #4   Title  does not have to wear extra toilet paper tucked into the anal region due to fecal leakage    Baseline  now is a habit; use just in case  Time  8    Period  Weeks    Status  Achieved      PT LONG TERM GOAL #5   Title  pelvic floor strength >/= 3/5 with good lift to reduce pelvic leakage    Time  5    Period  Weeks    Status  Achieved            Plan - 05/21/18 1204    Clinical Impression Statement  Patient has met all of her goals.  Pelvic floor strength is 4/5.  Bilateral hip strength is 5/5.  Patient is independent with her HEP.  Patient has had one leakage with stool on toilet paper.  Patient will have 1-2 drops of leakage when going from sit to stand first thing in the morning. Patient is ready for discharge.      Rehab Potential  Excellent    Clinical Impairments Affecting Rehab Potential  osteoporosis, hypothyroidism, Hysterectomy    PT Treatment/Interventions  Biofeedback;Therapeutic activities;Therapeutic exercise;Patient/family education;Neuromuscular re-education;Manual techniques;Passive range of motion;Dry needling    PT Next Visit Plan  Discharge to HEP this visit    PT Home Exercise Plan  Current HEP    Consulted and Agree with Plan of Care  Patient       Patient will benefit from skilled therapeutic intervention in order to improve the following deficits and impairments:  Increased fascial restricitons, Decreased mobility, Increased muscle spasms, Decreased strength, Decreased range of motion, Decreased coordination  Visit Diagnosis: Unspecified lack of coordination  Muscle weakness (generalized)  Other muscle spasm     Problem List There are no active problems to display for this patient.   Earlie Counts, PT 05/21/18 12:28 PM    [facility] [address], [place], [place], [address] Phone: [phone]   Fax:  [phone]  Name: DENEICE WACK MRN: [id number] Date of Birth: 31-Aug-1941  PHYSICAL THERAPY DISCHARGE SUMMARY  Visits from Start of Care: 14  Current functional level related to goals / functional outcomes: See above.    Remaining deficits: See above.    Education / Equipment: HEP Plan: Patient agrees to discharge.  Patient goals were met. Patient is being discharged due to meeting the stated rehab goals. Thank you for the referral. Earlie Counts, PT 05/21/18 12:28 PM   ?????

## 2018-05-21 NOTE — Patient Instructions (Signed)
Unilateral Isometric Hip Flexion    Tighten stomach and pelvic floor and raise right knee to outstretched arm. Push gently, keeping arm straight, trunk rigid. Hold __5__ seconds. Repeat __10__ times per set. Then do the left knee. Do ___1_ sets per session. Do _1___ sessions per day.DO NOT HOLD BREATH  Bracing With Bridging (Hook-Lying)    With neutral spine, tighten pelvic floor and abdominals and hold. Lift bottom. Repeat _15__ times. Do _1__ times a day. Keep band wrapped around your knees.   Quick Contraction: Gravity Resisted (Sitting)    Sitting, quickly squeeze then fully relax pelvic floor. Perform __1_ sets of __5_. Rest for _1__ seconds between sets. Do _2__ times a day.  Copyright  VHI. All rights reserved.  Slow Contraction: Gravity Resisted (Sitting)    Sitting, slowly squeeze pelvic floor for _10__ seconds. Rest for __20_ seconds. Repeat _5__ times. Do _2__ times a day.  Copyright  VHI. All rights reserved.  Quick Contraction: Gravity Resisted (Standing)    Standing, quickly squeeze then fully relax pelvic floor. Perform _1__ sets of __5_. Rest for _1__ seconds between sets. Do __2_ times a day.  Copyright  VHI. All rights reserved.  Slow Contraction: Gravity Resisted (Standing)    Standing, slowly squeeze pelvic floor for _10__ seconds. Rest for _20__ seconds. Repeat _5__ times. Do __2_ times a day.  Copyright  VHI. All rights reserved.  [facility] [address], [address] [place], [place] [address] Phone # [phone] Fax [phone]  Adduction: Hip - Knees Together With Pelvic Floor (Sitting)    Sit with towel roll between knees. Squeeze pelvic floor while pushing knees together. Hold for __5_ seconds. Rest for _5__ seconds. Repeat __10_ times. Do __2_ times a day.  Copyright  VHI. All rights reserved.  External Rotation: Hip - Knees Apart With Pelvic Floor (Sitting)    Sit, band tied just above knees. Squeeze  pelvic floor while pulling knees apart. Hold for _5__ seconds. Rest for _5__ seconds. Repeat _10__ times. Do _2__ times a day.  Copyright  VHI. All rights reserved.  [facility] [address], [address] [place], [place] [address] Phone # [phone] Fax [phone]

## 2019-11-12 ENCOUNTER — Ambulatory Visit: Payer: Medicare PPO | Attending: Internal Medicine

## 2019-11-12 DIAGNOSIS — Z23 Encounter for immunization: Secondary | ICD-10-CM

## 2019-11-12 NOTE — Progress Notes (Signed)
   Covid-19 Vaccination Clinic  Name:  MAITRI SCHNOEBELEN    MRN: [id number] DOB: 08-12-1941  11/12/2019  Ms. Duey was observed post Covid-19 immunization for 15 minutes without incidence. She was provided with Vaccine Information Sheet and instruction to access the V-Safe system.   Ms. Calkin was instructed to call 911 with any severe reactions post vaccine: Marland Kitchen Difficulty breathing  . Swelling of your face and throat  . A fast heartbeat  . A bad rash all over your body  . Dizziness and weakness    Immunizations Administered    Name Date Dose VIS Date Route   Pfizer COVID-19 Vaccine 11/12/2019  8:53 AM 0.3 mL 10/02/2019 Intramuscular   Manufacturer: [employer], [employer]   Lot: [id number]   NDC: [address]-4

## 2019-12-02 ENCOUNTER — Ambulatory Visit: Payer: Medicare PPO | Attending: Internal Medicine

## 2019-12-02 DIAGNOSIS — Z23 Encounter for immunization: Secondary | ICD-10-CM | POA: Insufficient documentation

## 2019-12-02 NOTE — Progress Notes (Signed)
   Covid-19 Vaccination Clinic  Name:  Laura Raymond    MRN: [id number] DOB: 10/19/1941  12/02/2019  [name] was observed post Covid-19 immunization for 15 minutes without incidence. She was provided with Vaccine Information Sheet and instruction to access the V-Safe system.   [name] was instructed to call 911 with any severe reactions post vaccine: Marland Kitchen Difficulty breathing  . Swelling of your face and throat  . A fast heartbeat  . A bad rash all over your body  . Dizziness and weakness    Immunizations Administered    Name Date Dose VIS Date Route   Pfizer COVID-19 Vaccine 12/02/2019  1:00 PM 0.3 mL 10/02/2019 Intramuscular   Manufacturer: [employer], [employer]   Lot: [id number]   NDC: [address]-2

## 2020-01-29 ENCOUNTER — Other Ambulatory Visit (HOSPITAL_COMMUNITY): Payer: Self-pay | Admitting: Family Medicine

## 2020-01-29 DIAGNOSIS — I341 Nonrheumatic mitral (valve) prolapse: Secondary | ICD-10-CM

## 2020-02-04 ENCOUNTER — Other Ambulatory Visit: Payer: Self-pay

## 2020-02-04 ENCOUNTER — Ambulatory Visit (HOSPITAL_COMMUNITY): Payer: Medicare PPO | Attending: Internal Medicine

## 2020-02-04 DIAGNOSIS — I341 Nonrheumatic mitral (valve) prolapse: Secondary | ICD-10-CM | POA: Insufficient documentation

## 2020-06-16 DIAGNOSIS — H8111 Benign paroxysmal vertigo, right ear: Secondary | ICD-10-CM | POA: Diagnosis not present

## 2020-06-20 DIAGNOSIS — H8111 Benign paroxysmal vertigo, right ear: Secondary | ICD-10-CM | POA: Diagnosis not present

## 2020-06-23 DIAGNOSIS — H8111 Benign paroxysmal vertigo, right ear: Secondary | ICD-10-CM | POA: Diagnosis not present

## 2020-06-28 DIAGNOSIS — H8111 Benign paroxysmal vertigo, right ear: Secondary | ICD-10-CM | POA: Diagnosis not present

## 2020-06-30 DIAGNOSIS — H8111 Benign paroxysmal vertigo, right ear: Secondary | ICD-10-CM | POA: Diagnosis not present

## 2020-08-04 DIAGNOSIS — E7800 Pure hypercholesterolemia, unspecified: Secondary | ICD-10-CM | POA: Diagnosis not present

## 2020-08-04 DIAGNOSIS — I129 Hypertensive chronic kidney disease with stage 1 through stage 4 chronic kidney disease, or unspecified chronic kidney disease: Secondary | ICD-10-CM | POA: Diagnosis not present

## 2020-08-04 DIAGNOSIS — F411 Generalized anxiety disorder: Secondary | ICD-10-CM | POA: Diagnosis not present

## 2020-08-04 DIAGNOSIS — N183 Chronic kidney disease, stage 3 unspecified: Secondary | ICD-10-CM | POA: Diagnosis not present

## 2020-08-04 DIAGNOSIS — I519 Heart disease, unspecified: Secondary | ICD-10-CM | POA: Diagnosis not present

## 2020-08-04 DIAGNOSIS — E039 Hypothyroidism, unspecified: Secondary | ICD-10-CM | POA: Diagnosis not present

## 2020-08-18 DIAGNOSIS — N179 Acute kidney failure, unspecified: Secondary | ICD-10-CM | POA: Diagnosis not present

## 2020-08-19 DIAGNOSIS — H8111 Benign paroxysmal vertigo, right ear: Secondary | ICD-10-CM | POA: Diagnosis not present

## 2020-08-29 DIAGNOSIS — H8111 Benign paroxysmal vertigo, right ear: Secondary | ICD-10-CM | POA: Diagnosis not present

## 2020-09-05 DIAGNOSIS — H8111 Benign paroxysmal vertigo, right ear: Secondary | ICD-10-CM | POA: Diagnosis not present

## 2020-09-19 DIAGNOSIS — H8111 Benign paroxysmal vertigo, right ear: Secondary | ICD-10-CM | POA: Diagnosis not present

## 2020-09-26 DIAGNOSIS — H8111 Benign paroxysmal vertigo, right ear: Secondary | ICD-10-CM | POA: Diagnosis not present

## 2020-10-03 DIAGNOSIS — H8111 Benign paroxysmal vertigo, right ear: Secondary | ICD-10-CM | POA: Diagnosis not present

## 2020-10-13 DIAGNOSIS — H8111 Benign paroxysmal vertigo, right ear: Secondary | ICD-10-CM | POA: Diagnosis not present

## 2020-10-19 DIAGNOSIS — Z1231 Encounter for screening mammogram for malignant neoplasm of breast: Secondary | ICD-10-CM | POA: Diagnosis not present

## 2020-10-24 DIAGNOSIS — H8111 Benign paroxysmal vertigo, right ear: Secondary | ICD-10-CM | POA: Diagnosis not present

## 2020-10-31 DIAGNOSIS — H8111 Benign paroxysmal vertigo, right ear: Secondary | ICD-10-CM | POA: Diagnosis not present

## 2020-11-08 DIAGNOSIS — H8111 Benign paroxysmal vertigo, right ear: Secondary | ICD-10-CM | POA: Diagnosis not present

## 2020-11-14 DIAGNOSIS — H8111 Benign paroxysmal vertigo, right ear: Secondary | ICD-10-CM | POA: Diagnosis not present

## 2020-11-23 DIAGNOSIS — H8111 Benign paroxysmal vertigo, right ear: Secondary | ICD-10-CM | POA: Diagnosis not present

## 2020-12-07 DIAGNOSIS — H8111 Benign paroxysmal vertigo, right ear: Secondary | ICD-10-CM | POA: Diagnosis not present

## 2020-12-21 DIAGNOSIS — H8111 Benign paroxysmal vertigo, right ear: Secondary | ICD-10-CM | POA: Diagnosis not present

## 2020-12-28 DIAGNOSIS — H8111 Benign paroxysmal vertigo, right ear: Secondary | ICD-10-CM | POA: Diagnosis not present

## 2021-01-16 DIAGNOSIS — H8111 Benign paroxysmal vertigo, right ear: Secondary | ICD-10-CM | POA: Diagnosis not present

## 2021-01-23 DIAGNOSIS — H8111 Benign paroxysmal vertigo, right ear: Secondary | ICD-10-CM | POA: Diagnosis not present

## 2021-01-30 DIAGNOSIS — I129 Hypertensive chronic kidney disease with stage 1 through stage 4 chronic kidney disease, or unspecified chronic kidney disease: Secondary | ICD-10-CM | POA: Diagnosis not present

## 2021-01-30 DIAGNOSIS — R251 Tremor, unspecified: Secondary | ICD-10-CM | POA: Diagnosis not present

## 2021-01-30 DIAGNOSIS — E7800 Pure hypercholesterolemia, unspecified: Secondary | ICD-10-CM | POA: Diagnosis not present

## 2021-01-30 DIAGNOSIS — F411 Generalized anxiety disorder: Secondary | ICD-10-CM | POA: Diagnosis not present

## 2021-01-30 DIAGNOSIS — Z0000 Encounter for general adult medical examination without abnormal findings: Secondary | ICD-10-CM | POA: Diagnosis not present

## 2021-01-30 DIAGNOSIS — E559 Vitamin D deficiency, unspecified: Secondary | ICD-10-CM | POA: Diagnosis not present

## 2021-01-30 DIAGNOSIS — H35039 Hypertensive retinopathy, unspecified eye: Secondary | ICD-10-CM | POA: Diagnosis not present

## 2021-01-30 DIAGNOSIS — N183 Chronic kidney disease, stage 3 unspecified: Secondary | ICD-10-CM | POA: Diagnosis not present

## 2021-01-30 DIAGNOSIS — E039 Hypothyroidism, unspecified: Secondary | ICD-10-CM | POA: Diagnosis not present

## 2021-02-01 DIAGNOSIS — H8111 Benign paroxysmal vertigo, right ear: Secondary | ICD-10-CM | POA: Diagnosis not present

## 2021-02-02 DIAGNOSIS — H8111 Benign paroxysmal vertigo, right ear: Secondary | ICD-10-CM | POA: Diagnosis not present

## 2021-02-06 DIAGNOSIS — H8111 Benign paroxysmal vertigo, right ear: Secondary | ICD-10-CM | POA: Diagnosis not present

## 2021-02-13 DIAGNOSIS — H8111 Benign paroxysmal vertigo, right ear: Secondary | ICD-10-CM | POA: Diagnosis not present

## 2021-05-18 DIAGNOSIS — H04123 Dry eye syndrome of bilateral lacrimal glands: Secondary | ICD-10-CM | POA: Diagnosis not present

## 2021-05-18 DIAGNOSIS — Z961 Presence of intraocular lens: Secondary | ICD-10-CM | POA: Diagnosis not present

## 2021-05-18 DIAGNOSIS — H26493 Other secondary cataract, bilateral: Secondary | ICD-10-CM | POA: Diagnosis not present

## 2021-05-18 DIAGNOSIS — H524 Presbyopia: Secondary | ICD-10-CM | POA: Diagnosis not present

## 2021-08-02 DIAGNOSIS — E039 Hypothyroidism, unspecified: Secondary | ICD-10-CM | POA: Diagnosis not present

## 2021-08-02 DIAGNOSIS — N183 Chronic kidney disease, stage 3 unspecified: Secondary | ICD-10-CM | POA: Diagnosis not present

## 2021-08-02 DIAGNOSIS — I129 Hypertensive chronic kidney disease with stage 1 through stage 4 chronic kidney disease, or unspecified chronic kidney disease: Secondary | ICD-10-CM | POA: Diagnosis not present

## 2021-08-02 DIAGNOSIS — D692 Other nonthrombocytopenic purpura: Secondary | ICD-10-CM | POA: Diagnosis not present

## 2021-08-02 DIAGNOSIS — E7800 Pure hypercholesterolemia, unspecified: Secondary | ICD-10-CM | POA: Diagnosis not present

## 2021-08-02 DIAGNOSIS — F411 Generalized anxiety disorder: Secondary | ICD-10-CM | POA: Diagnosis not present

## 2021-08-09 DIAGNOSIS — F411 Generalized anxiety disorder: Secondary | ICD-10-CM | POA: Diagnosis not present

## 2021-08-09 DIAGNOSIS — K219 Gastro-esophageal reflux disease without esophagitis: Secondary | ICD-10-CM | POA: Diagnosis not present

## 2021-08-09 DIAGNOSIS — M1990 Unspecified osteoarthritis, unspecified site: Secondary | ICD-10-CM | POA: Diagnosis not present

## 2021-08-09 DIAGNOSIS — B009 Herpesviral infection, unspecified: Secondary | ICD-10-CM | POA: Diagnosis not present

## 2021-08-09 DIAGNOSIS — F32A Depression, unspecified: Secondary | ICD-10-CM | POA: Diagnosis not present

## 2021-08-09 DIAGNOSIS — M810 Age-related osteoporosis without current pathological fracture: Secondary | ICD-10-CM | POA: Diagnosis not present

## 2021-08-09 DIAGNOSIS — E039 Hypothyroidism, unspecified: Secondary | ICD-10-CM | POA: Diagnosis not present

## 2021-08-09 DIAGNOSIS — I129 Hypertensive chronic kidney disease with stage 1 through stage 4 chronic kidney disease, or unspecified chronic kidney disease: Secondary | ICD-10-CM | POA: Diagnosis not present

## 2021-08-09 DIAGNOSIS — E785 Hyperlipidemia, unspecified: Secondary | ICD-10-CM | POA: Diagnosis not present

## 2021-08-11 DIAGNOSIS — E039 Hypothyroidism, unspecified: Secondary | ICD-10-CM | POA: Diagnosis not present

## 2021-08-11 DIAGNOSIS — F411 Generalized anxiety disorder: Secondary | ICD-10-CM | POA: Diagnosis not present

## 2021-08-11 DIAGNOSIS — I10 Essential (primary) hypertension: Secondary | ICD-10-CM | POA: Diagnosis not present

## 2021-08-25 DIAGNOSIS — F410 Panic disorder [episodic paroxysmal anxiety] without agoraphobia: Secondary | ICD-10-CM | POA: Diagnosis not present

## 2021-08-25 DIAGNOSIS — E039 Hypothyroidism, unspecified: Secondary | ICD-10-CM | POA: Diagnosis not present

## 2021-08-25 DIAGNOSIS — I129 Hypertensive chronic kidney disease with stage 1 through stage 4 chronic kidney disease, or unspecified chronic kidney disease: Secondary | ICD-10-CM | POA: Diagnosis not present

## 2021-09-20 DIAGNOSIS — E039 Hypothyroidism, unspecified: Secondary | ICD-10-CM | POA: Diagnosis not present

## 2021-09-20 DIAGNOSIS — E875 Hyperkalemia: Secondary | ICD-10-CM | POA: Diagnosis not present

## 2021-09-22 DIAGNOSIS — I129 Hypertensive chronic kidney disease with stage 1 through stage 4 chronic kidney disease, or unspecified chronic kidney disease: Secondary | ICD-10-CM | POA: Diagnosis not present

## 2021-09-22 DIAGNOSIS — R0989 Other specified symptoms and signs involving the circulatory and respiratory systems: Secondary | ICD-10-CM | POA: Diagnosis not present

## 2021-09-22 DIAGNOSIS — E039 Hypothyroidism, unspecified: Secondary | ICD-10-CM | POA: Diagnosis not present

## 2021-09-26 ENCOUNTER — Other Ambulatory Visit: Payer: Self-pay | Admitting: Family Medicine

## 2021-09-26 DIAGNOSIS — R0989 Other specified symptoms and signs involving the circulatory and respiratory systems: Secondary | ICD-10-CM

## 2021-09-29 ENCOUNTER — Ambulatory Visit
Admission: RE | Admit: 2021-09-29 | Discharge: 2021-09-29 | Disposition: A | Discharge status: Home / Self Care | Payer: Medicare PPO | Source: Ambulatory Visit | Attending: Family Medicine | Admitting: Family Medicine

## 2021-09-29 DIAGNOSIS — I771 Stricture of artery: Secondary | ICD-10-CM | POA: Diagnosis not present

## 2021-09-29 DIAGNOSIS — R0989 Other specified symptoms and signs involving the circulatory and respiratory systems: Secondary | ICD-10-CM | POA: Diagnosis not present

## 2021-09-29 DIAGNOSIS — I6523 Occlusion and stenosis of bilateral carotid arteries: Secondary | ICD-10-CM | POA: Diagnosis not present

## 2021-10-25 DIAGNOSIS — Z1231 Encounter for screening mammogram for malignant neoplasm of breast: Secondary | ICD-10-CM | POA: Diagnosis not present

## 2021-11-18 DIAGNOSIS — Z1152 Encounter for screening for COVID-19: Secondary | ICD-10-CM | POA: Diagnosis not present

## 2021-11-18 DIAGNOSIS — J029 Acute pharyngitis, unspecified: Secondary | ICD-10-CM | POA: Diagnosis not present

## 2021-11-18 DIAGNOSIS — Z20822 Contact with and (suspected) exposure to covid-19: Secondary | ICD-10-CM | POA: Diagnosis not present

## 2021-11-18 DIAGNOSIS — R0981 Nasal congestion: Secondary | ICD-10-CM | POA: Diagnosis not present

## 2021-11-18 DIAGNOSIS — I10 Essential (primary) hypertension: Secondary | ICD-10-CM | POA: Diagnosis not present

## 2021-11-29 DIAGNOSIS — Z780 Asymptomatic menopausal state: Secondary | ICD-10-CM | POA: Diagnosis not present

## 2021-11-29 DIAGNOSIS — M8589 Other specified disorders of bone density and structure, multiple sites: Secondary | ICD-10-CM | POA: Diagnosis not present

## 2021-11-29 DIAGNOSIS — M810 Age-related osteoporosis without current pathological fracture: Secondary | ICD-10-CM | POA: Diagnosis not present

## 2022-01-19 DIAGNOSIS — H8111 Benign paroxysmal vertigo, right ear: Secondary | ICD-10-CM | POA: Diagnosis not present

## 2022-02-08 DIAGNOSIS — M810 Age-related osteoporosis without current pathological fracture: Secondary | ICD-10-CM | POA: Diagnosis not present

## 2022-02-08 DIAGNOSIS — E7800 Pure hypercholesterolemia, unspecified: Secondary | ICD-10-CM | POA: Diagnosis not present

## 2022-02-08 DIAGNOSIS — E559 Vitamin D deficiency, unspecified: Secondary | ICD-10-CM | POA: Diagnosis not present

## 2022-02-08 DIAGNOSIS — F411 Generalized anxiety disorder: Secondary | ICD-10-CM | POA: Diagnosis not present

## 2022-02-08 DIAGNOSIS — E039 Hypothyroidism, unspecified: Secondary | ICD-10-CM | POA: Diagnosis not present

## 2022-02-08 DIAGNOSIS — I129 Hypertensive chronic kidney disease with stage 1 through stage 4 chronic kidney disease, or unspecified chronic kidney disease: Secondary | ICD-10-CM | POA: Diagnosis not present

## 2022-02-08 DIAGNOSIS — I519 Heart disease, unspecified: Secondary | ICD-10-CM | POA: Diagnosis not present

## 2022-02-08 DIAGNOSIS — Z0000 Encounter for general adult medical examination without abnormal findings: Secondary | ICD-10-CM | POA: Diagnosis not present

## 2022-02-08 DIAGNOSIS — I350 Nonrheumatic aortic (valve) stenosis: Secondary | ICD-10-CM | POA: Diagnosis not present

## 2022-02-08 DIAGNOSIS — H35039 Hypertensive retinopathy, unspecified eye: Secondary | ICD-10-CM | POA: Diagnosis not present

## 2022-02-09 DIAGNOSIS — H8111 Benign paroxysmal vertigo, right ear: Secondary | ICD-10-CM | POA: Diagnosis not present

## 2022-02-23 DIAGNOSIS — H8111 Benign paroxysmal vertigo, right ear: Secondary | ICD-10-CM | POA: Diagnosis not present

## 2022-03-09 DIAGNOSIS — H8111 Benign paroxysmal vertigo, right ear: Secondary | ICD-10-CM | POA: Diagnosis not present

## 2022-03-23 DIAGNOSIS — H8111 Benign paroxysmal vertigo, right ear: Secondary | ICD-10-CM | POA: Diagnosis not present

## 2022-03-30 DIAGNOSIS — H8111 Benign paroxysmal vertigo, right ear: Secondary | ICD-10-CM | POA: Diagnosis not present

## 2022-04-12 DIAGNOSIS — E039 Hypothyroidism, unspecified: Secondary | ICD-10-CM | POA: Diagnosis not present

## 2022-04-20 DIAGNOSIS — H8111 Benign paroxysmal vertigo, right ear: Secondary | ICD-10-CM | POA: Diagnosis not present

## 2022-05-10 DIAGNOSIS — H8111 Benign paroxysmal vertigo, right ear: Secondary | ICD-10-CM | POA: Diagnosis not present

## 2022-05-18 DIAGNOSIS — H8111 Benign paroxysmal vertigo, right ear: Secondary | ICD-10-CM | POA: Diagnosis not present

## 2022-05-23 DIAGNOSIS — H8111 Benign paroxysmal vertigo, right ear: Secondary | ICD-10-CM | POA: Diagnosis not present

## 2022-06-13 DIAGNOSIS — M810 Age-related osteoporosis without current pathological fracture: Secondary | ICD-10-CM | POA: Diagnosis not present

## 2022-06-13 DIAGNOSIS — E039 Hypothyroidism, unspecified: Secondary | ICD-10-CM | POA: Diagnosis not present

## 2022-06-13 DIAGNOSIS — E559 Vitamin D deficiency, unspecified: Secondary | ICD-10-CM | POA: Diagnosis not present

## 2022-06-13 DIAGNOSIS — I10 Essential (primary) hypertension: Secondary | ICD-10-CM | POA: Diagnosis not present

## 2022-06-13 DIAGNOSIS — N183 Chronic kidney disease, stage 3 unspecified: Secondary | ICD-10-CM | POA: Diagnosis not present

## 2022-06-15 DIAGNOSIS — H8111 Benign paroxysmal vertigo, right ear: Secondary | ICD-10-CM | POA: Diagnosis not present

## 2022-06-29 DIAGNOSIS — H8111 Benign paroxysmal vertigo, right ear: Secondary | ICD-10-CM | POA: Diagnosis not present

## 2022-07-13 DIAGNOSIS — H8111 Benign paroxysmal vertigo, right ear: Secondary | ICD-10-CM | POA: Diagnosis not present

## 2022-07-30 IMAGING — US US CAROTID DUPLEX BILAT
1 series · 13 of 24 positions shown · non-contrast
Comparison: 03/07/2012

CLINICAL DATA: 80-year-old female with carotid bruit

EXAM:
BILATERAL CAROTID DUPLEX ULTRASOUND
TECHNIQUE: Gray scale imaging, color Doppler and duplex ultrasound were
performed of bilateral carotid and vertebral arteries in the neck.

[Series 1: us carotid duplex bilat · 0.06mm/px · 13 of 66 slices shown]
[im 1/66]
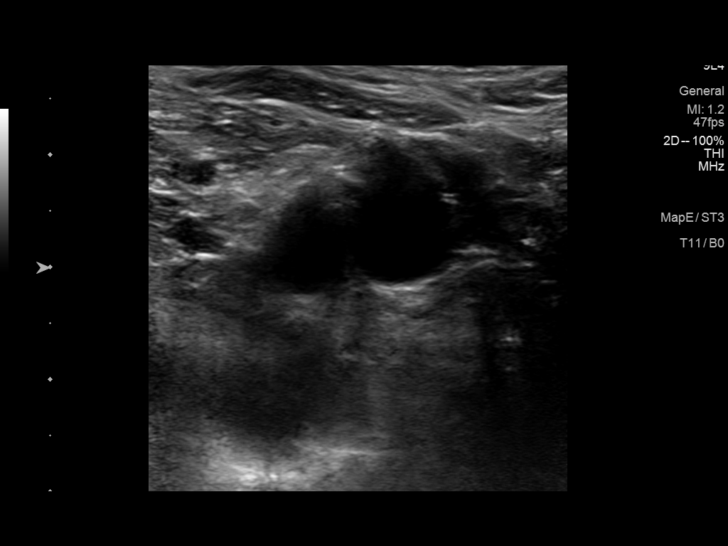
[im 6/66]
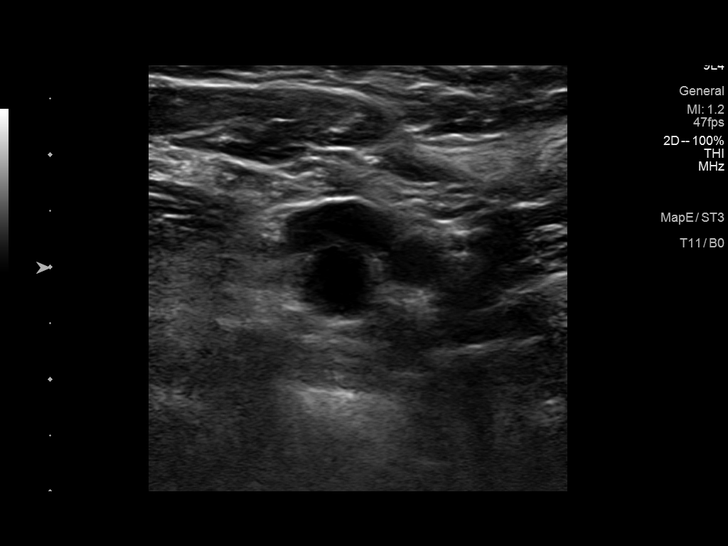
[im 12/66]
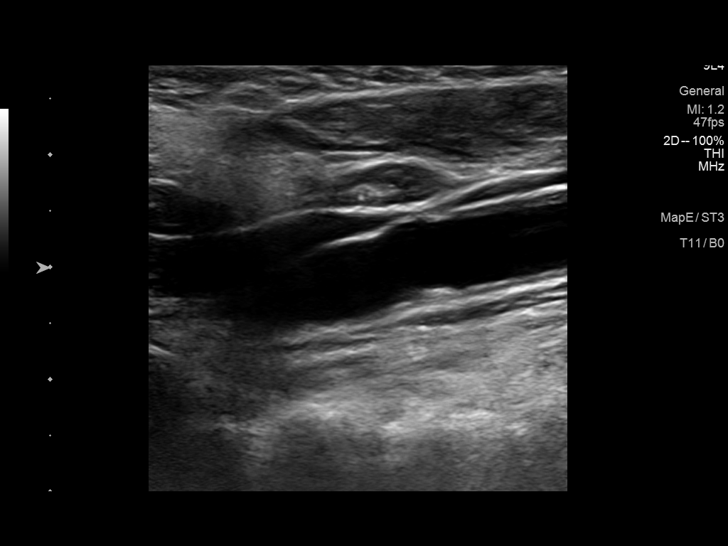
[im 17/66]
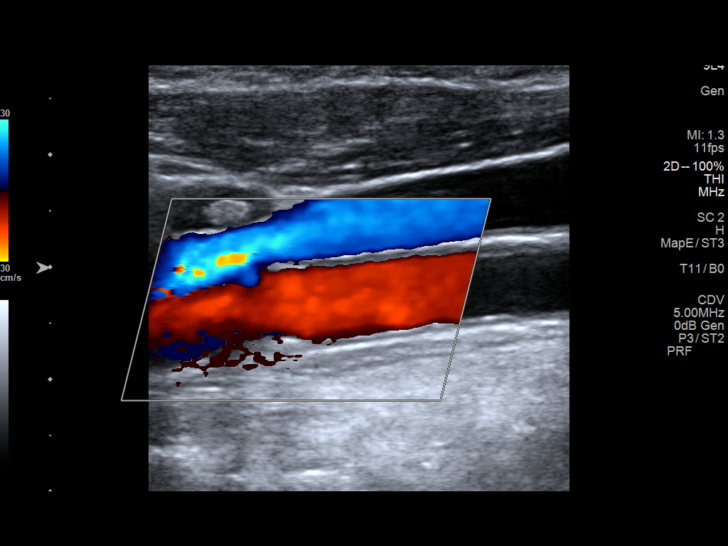
[im 23/66]
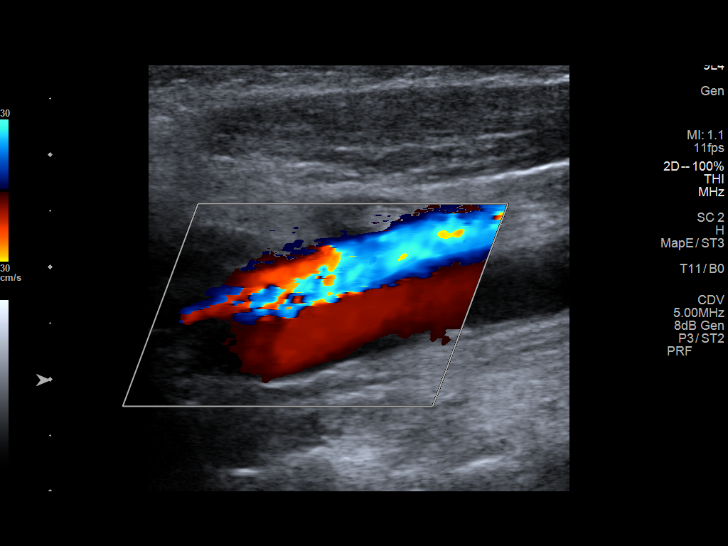
[im 29/66]
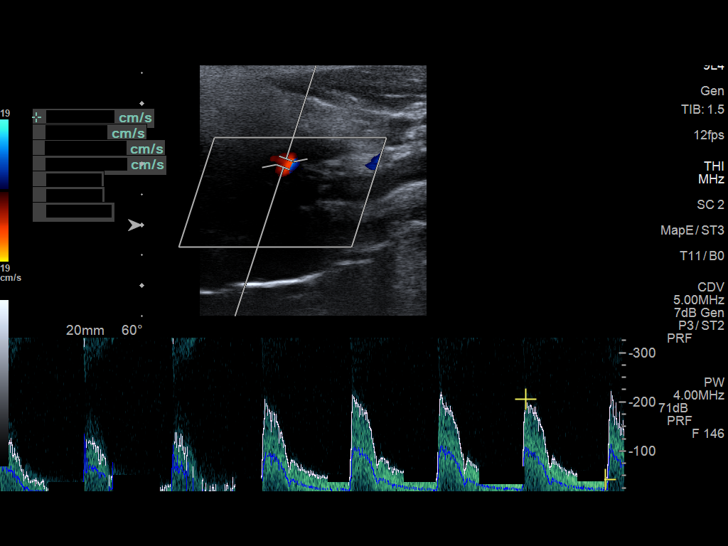
[im 34/66]
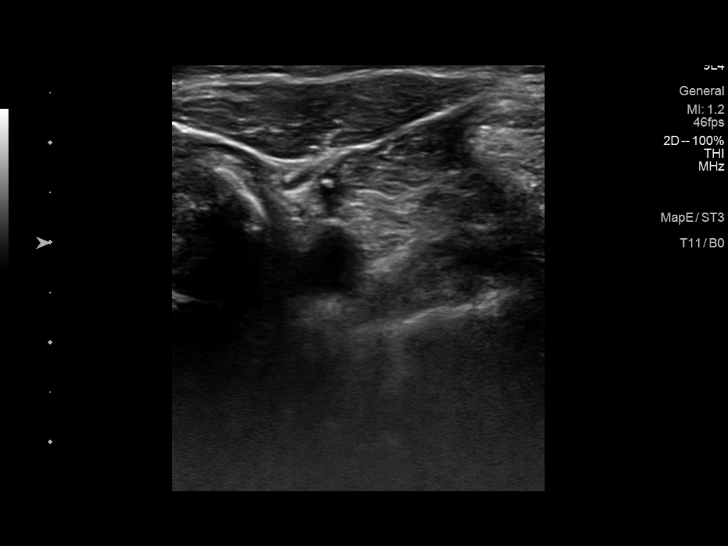
[im 37/66]
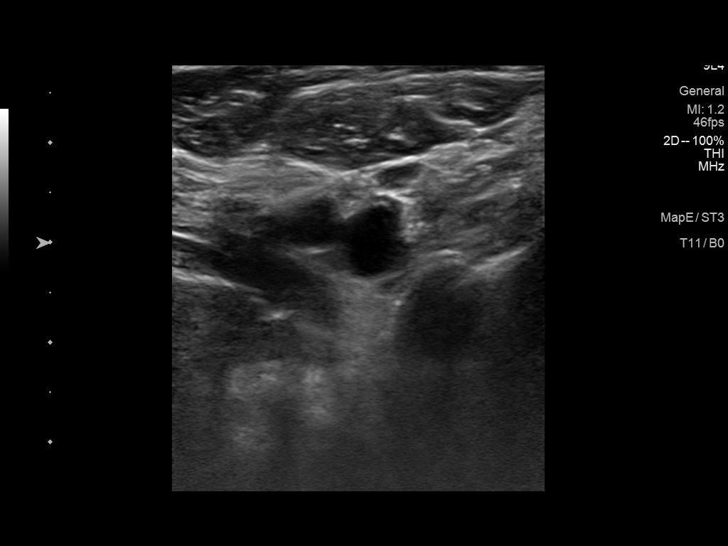
[im 43/66]
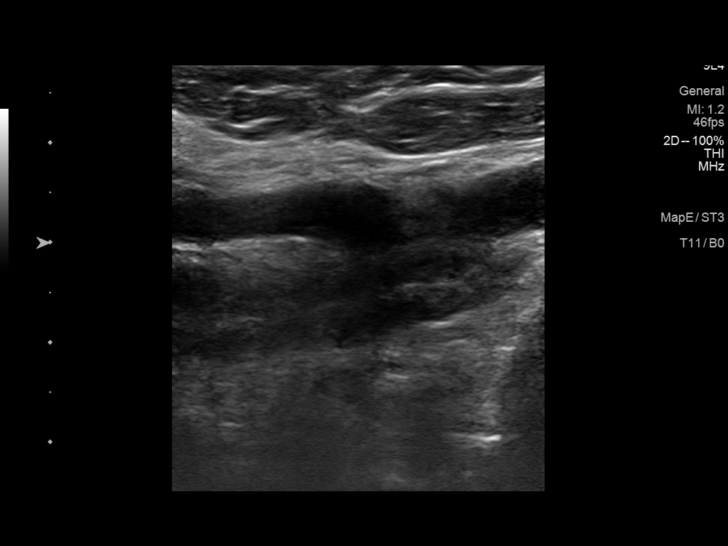
[im 49/66]
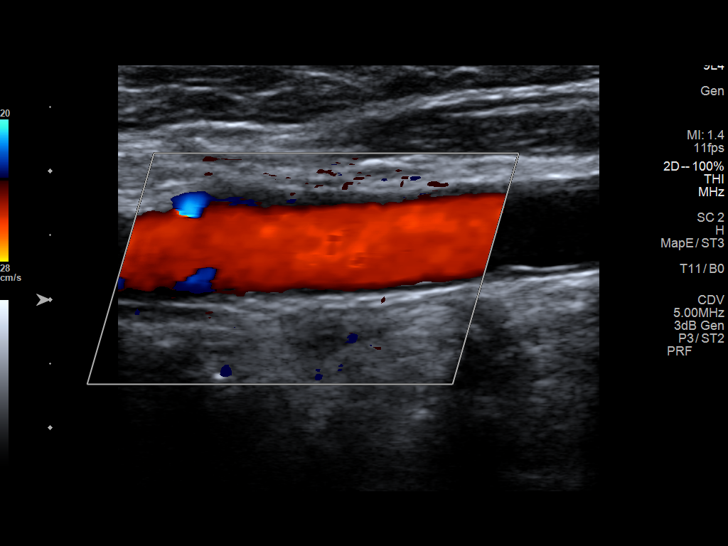
[im 54/66]
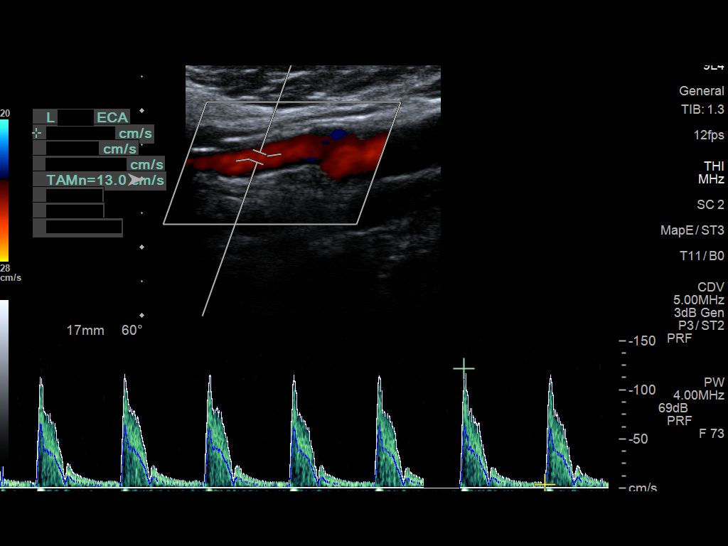
[im 60/66]
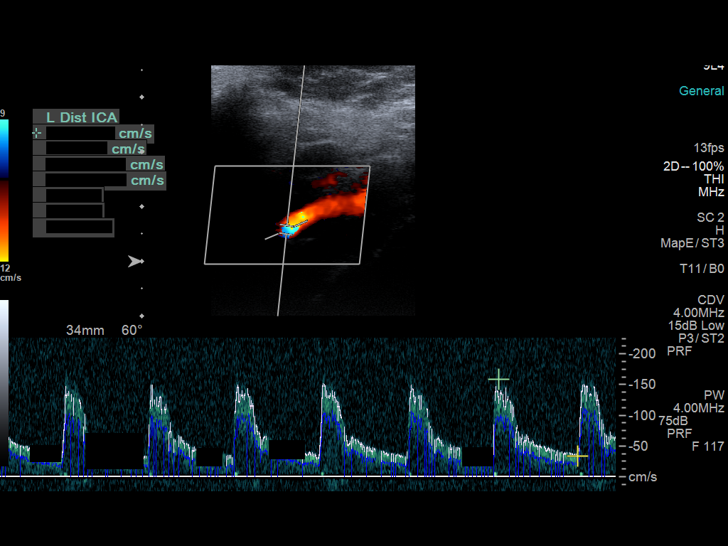
[im 66/66]
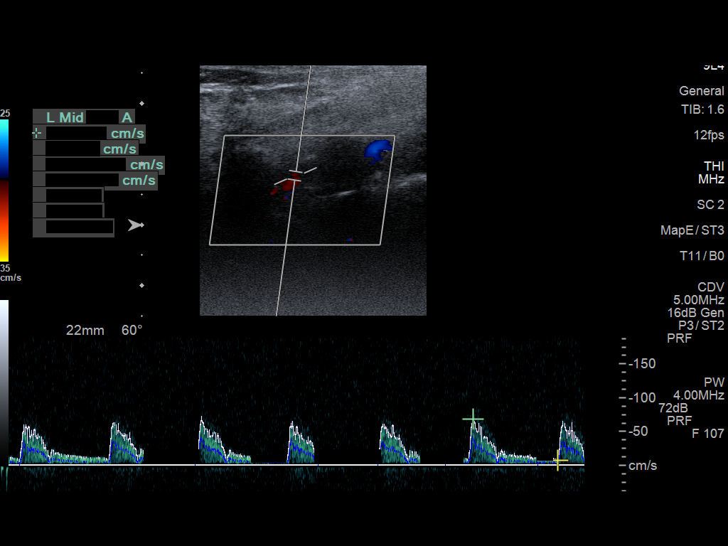

[13 of 24 positions shown; findings below may reference images not displayed]

FINDINGS: Criteria: Quantification of carotid stenosis is based on velocity
parameters that correlate the residual internal carotid diameter
with NASCET-based stenosis levels, using the diameter of the distal
internal carotid lumen as the denominator for stenosis measurement.

The following velocity measurements were obtained:

RIGHT

ICA:  Systolic 220 cm/sec, Diastolic 54 cm/sec

CCA:  103 cm/sec

SYSTOLIC ICA/CCA RATIO:

ECA:  121 cm/sec

LEFT

ICA:  Systolic 256 cm/sec, Diastolic 51 cm/sec

CCA:  135 cm/sec

SYSTOLIC ICA/CCA RATIO:

ECA:  122 cm/sec

Right Brachial SBP: 140

Left Brachial SBP: 148

RIGHT CAROTID ARTERY: No significant calcified disease of the right
common carotid artery. Intermediate waveform maintained. Homogeneous
plaque without significant calcifications at the right carotid
bifurcation. Low resistance waveform of the right ICA. Tortuosity

RIGHT VERTEBRAL ARTERY: Antegrade flow with low resistance waveform.

LEFT CAROTID ARTERY: No significant calcified disease of the left
common carotid artery. Intermediate waveform maintained. Homogeneous
plaque at the left carotid bifurcation without significant
calcifications. Low resistance waveform of the left ICA. Tortuosity

LEFT VERTEBRAL ARTERY:  Antegrade flow with low resistance waveform.
IMPRESSION: Homogeneous plaque at the bilateral carotid arteries, with directed
duplex demonstrating 50%-69% stenosis bilaterally based on
established duplex criteria. Given the more distal location of the
measured velocity elevation, fibromuscular dysplasia may be
considered as underlying etiology. CT angiogram may be considered if
further diagnostic imaging is warranted.

## 2022-08-10 DIAGNOSIS — M810 Age-related osteoporosis without current pathological fracture: Secondary | ICD-10-CM | POA: Diagnosis not present

## 2022-08-10 DIAGNOSIS — E7800 Pure hypercholesterolemia, unspecified: Secondary | ICD-10-CM | POA: Diagnosis not present

## 2022-08-10 DIAGNOSIS — N183 Chronic kidney disease, stage 3 unspecified: Secondary | ICD-10-CM | POA: Diagnosis not present

## 2022-08-10 DIAGNOSIS — I129 Hypertensive chronic kidney disease with stage 1 through stage 4 chronic kidney disease, or unspecified chronic kidney disease: Secondary | ICD-10-CM | POA: Diagnosis not present

## 2022-08-10 DIAGNOSIS — E039 Hypothyroidism, unspecified: Secondary | ICD-10-CM | POA: Diagnosis not present

## 2022-08-10 DIAGNOSIS — L989 Disorder of the skin and subcutaneous tissue, unspecified: Secondary | ICD-10-CM | POA: Diagnosis not present

## 2022-08-10 DIAGNOSIS — Z1211 Encounter for screening for malignant neoplasm of colon: Secondary | ICD-10-CM | POA: Diagnosis not present

## 2022-08-15 DIAGNOSIS — K6289 Other specified diseases of anus and rectum: Secondary | ICD-10-CM | POA: Diagnosis not present

## 2022-08-15 DIAGNOSIS — Z86010 Personal history of colonic polyps: Secondary | ICD-10-CM | POA: Diagnosis not present

## 2022-08-15 DIAGNOSIS — R1314 Dysphagia, pharyngoesophageal phase: Secondary | ICD-10-CM | POA: Diagnosis not present

## 2022-08-17 DIAGNOSIS — H8111 Benign paroxysmal vertigo, right ear: Secondary | ICD-10-CM | POA: Diagnosis not present

## 2022-09-05 DIAGNOSIS — D485 Neoplasm of uncertain behavior of skin: Secondary | ICD-10-CM | POA: Diagnosis not present

## 2022-09-05 DIAGNOSIS — D0439 Carcinoma in situ of skin of other parts of face: Secondary | ICD-10-CM | POA: Diagnosis not present

## 2022-09-19 DIAGNOSIS — K3189 Other diseases of stomach and duodenum: Secondary | ICD-10-CM | POA: Diagnosis not present

## 2022-09-19 DIAGNOSIS — Z86010 Personal history of colonic polyps: Secondary | ICD-10-CM | POA: Diagnosis not present

## 2022-09-19 DIAGNOSIS — D122 Benign neoplasm of ascending colon: Secondary | ICD-10-CM | POA: Diagnosis not present

## 2022-09-19 DIAGNOSIS — K2289 Other specified disease of esophagus: Secondary | ICD-10-CM | POA: Diagnosis not present

## 2022-09-19 DIAGNOSIS — Z09 Encounter for follow-up examination after completed treatment for conditions other than malignant neoplasm: Secondary | ICD-10-CM | POA: Diagnosis not present

## 2022-09-19 DIAGNOSIS — K2930 Chronic superficial gastritis without bleeding: Secondary | ICD-10-CM | POA: Diagnosis not present

## 2022-09-19 DIAGNOSIS — K6289 Other specified diseases of anus and rectum: Secondary | ICD-10-CM | POA: Diagnosis not present

## 2022-09-19 DIAGNOSIS — R1310 Dysphagia, unspecified: Secondary | ICD-10-CM | POA: Diagnosis not present

## 2022-09-21 DIAGNOSIS — K2930 Chronic superficial gastritis without bleeding: Secondary | ICD-10-CM | POA: Diagnosis not present

## 2022-09-21 DIAGNOSIS — M545 Low back pain, unspecified: Secondary | ICD-10-CM | POA: Diagnosis not present

## 2022-09-21 DIAGNOSIS — K2289 Other specified disease of esophagus: Secondary | ICD-10-CM | POA: Diagnosis not present

## 2022-09-21 DIAGNOSIS — D122 Benign neoplasm of ascending colon: Secondary | ICD-10-CM | POA: Diagnosis not present

## 2022-10-05 DIAGNOSIS — M545 Low back pain, unspecified: Secondary | ICD-10-CM | POA: Diagnosis not present

## 2022-10-16 DIAGNOSIS — Z85828 Personal history of other malignant neoplasm of skin: Secondary | ICD-10-CM | POA: Diagnosis not present

## 2022-10-16 DIAGNOSIS — C44329 Squamous cell carcinoma of skin of other parts of face: Secondary | ICD-10-CM | POA: Diagnosis not present

## 2022-10-24 DIAGNOSIS — D1801 Hemangioma of skin and subcutaneous tissue: Secondary | ICD-10-CM | POA: Diagnosis not present

## 2022-10-24 DIAGNOSIS — L821 Other seborrheic keratosis: Secondary | ICD-10-CM | POA: Diagnosis not present

## 2022-10-24 DIAGNOSIS — L638 Other alopecia areata: Secondary | ICD-10-CM | POA: Diagnosis not present

## 2022-10-24 DIAGNOSIS — Z85828 Personal history of other malignant neoplasm of skin: Secondary | ICD-10-CM | POA: Diagnosis not present

## 2022-10-24 DIAGNOSIS — L218 Other seborrheic dermatitis: Secondary | ICD-10-CM | POA: Diagnosis not present

## 2022-10-24 DIAGNOSIS — D225 Melanocytic nevi of trunk: Secondary | ICD-10-CM | POA: Diagnosis not present

## 2022-10-24 DIAGNOSIS — L570 Actinic keratosis: Secondary | ICD-10-CM | POA: Diagnosis not present

## 2022-10-24 DIAGNOSIS — L738 Other specified follicular disorders: Secondary | ICD-10-CM | POA: Diagnosis not present

## 2022-10-24 DIAGNOSIS — L853 Xerosis cutis: Secondary | ICD-10-CM | POA: Diagnosis not present

## 2022-11-15 DIAGNOSIS — R922 Inconclusive mammogram: Secondary | ICD-10-CM | POA: Diagnosis not present

## 2022-11-15 DIAGNOSIS — N644 Mastodynia: Secondary | ICD-10-CM | POA: Diagnosis not present

## 2022-11-16 DIAGNOSIS — M545 Low back pain, unspecified: Secondary | ICD-10-CM | POA: Diagnosis not present

## 2023-01-04 DIAGNOSIS — M545 Low back pain, unspecified: Secondary | ICD-10-CM | POA: Diagnosis not present

## 2023-01-18 DIAGNOSIS — M545 Low back pain, unspecified: Secondary | ICD-10-CM | POA: Diagnosis not present

## 2023-02-01 DIAGNOSIS — M545 Low back pain, unspecified: Secondary | ICD-10-CM | POA: Diagnosis not present

## 2023-02-13 DIAGNOSIS — I129 Hypertensive chronic kidney disease with stage 1 through stage 4 chronic kidney disease, or unspecified chronic kidney disease: Secondary | ICD-10-CM | POA: Diagnosis not present

## 2023-02-13 DIAGNOSIS — I350 Nonrheumatic aortic (valve) stenosis: Secondary | ICD-10-CM | POA: Diagnosis not present

## 2023-02-13 DIAGNOSIS — E559 Vitamin D deficiency, unspecified: Secondary | ICD-10-CM | POA: Diagnosis not present

## 2023-02-13 DIAGNOSIS — M810 Age-related osteoporosis without current pathological fracture: Secondary | ICD-10-CM | POA: Diagnosis not present

## 2023-02-13 DIAGNOSIS — I519 Heart disease, unspecified: Secondary | ICD-10-CM | POA: Diagnosis not present

## 2023-02-13 DIAGNOSIS — N183 Chronic kidney disease, stage 3 unspecified: Secondary | ICD-10-CM | POA: Diagnosis not present

## 2023-02-13 DIAGNOSIS — E039 Hypothyroidism, unspecified: Secondary | ICD-10-CM | POA: Diagnosis not present

## 2023-02-13 DIAGNOSIS — E7800 Pure hypercholesterolemia, unspecified: Secondary | ICD-10-CM | POA: Diagnosis not present

## 2023-02-13 DIAGNOSIS — H35039 Hypertensive retinopathy, unspecified eye: Secondary | ICD-10-CM | POA: Diagnosis not present

## 2023-02-13 DIAGNOSIS — F411 Generalized anxiety disorder: Secondary | ICD-10-CM | POA: Diagnosis not present

## 2023-02-13 DIAGNOSIS — Z0000 Encounter for general adult medical examination without abnormal findings: Secondary | ICD-10-CM | POA: Diagnosis not present

## 2023-02-22 DIAGNOSIS — M545 Low back pain, unspecified: Secondary | ICD-10-CM | POA: Diagnosis not present

## 2023-03-08 DIAGNOSIS — M545 Low back pain, unspecified: Secondary | ICD-10-CM | POA: Diagnosis not present

## 2023-03-22 DIAGNOSIS — M545 Low back pain, unspecified: Secondary | ICD-10-CM | POA: Diagnosis not present

## 2023-04-05 DIAGNOSIS — M545 Low back pain, unspecified: Secondary | ICD-10-CM | POA: Diagnosis not present

## 2023-04-26 DIAGNOSIS — M545 Low back pain, unspecified: Secondary | ICD-10-CM | POA: Diagnosis not present

## 2023-05-04 DIAGNOSIS — F411 Generalized anxiety disorder: Secondary | ICD-10-CM | POA: Diagnosis not present

## 2023-05-04 DIAGNOSIS — N189 Chronic kidney disease, unspecified: Secondary | ICD-10-CM | POA: Diagnosis not present

## 2023-05-04 DIAGNOSIS — M1990 Unspecified osteoarthritis, unspecified site: Secondary | ICD-10-CM | POA: Diagnosis not present

## 2023-05-04 DIAGNOSIS — K219 Gastro-esophageal reflux disease without esophagitis: Secondary | ICD-10-CM | POA: Diagnosis not present

## 2023-05-04 DIAGNOSIS — I129 Hypertensive chronic kidney disease with stage 1 through stage 4 chronic kidney disease, or unspecified chronic kidney disease: Secondary | ICD-10-CM | POA: Diagnosis not present

## 2023-05-04 DIAGNOSIS — E785 Hyperlipidemia, unspecified: Secondary | ICD-10-CM | POA: Diagnosis not present

## 2023-05-04 DIAGNOSIS — F324 Major depressive disorder, single episode, in partial remission: Secondary | ICD-10-CM | POA: Diagnosis not present

## 2023-05-04 DIAGNOSIS — R32 Unspecified urinary incontinence: Secondary | ICD-10-CM | POA: Diagnosis not present

## 2023-05-04 DIAGNOSIS — E039 Hypothyroidism, unspecified: Secondary | ICD-10-CM | POA: Diagnosis not present

## 2023-05-09 DIAGNOSIS — M545 Low back pain, unspecified: Secondary | ICD-10-CM | POA: Diagnosis not present

## 2023-06-20 DIAGNOSIS — M545 Low back pain, unspecified: Secondary | ICD-10-CM | POA: Diagnosis not present

## 2023-07-04 DIAGNOSIS — M545 Low back pain, unspecified: Secondary | ICD-10-CM | POA: Diagnosis not present

## 2023-07-12 DIAGNOSIS — M545 Low back pain, unspecified: Secondary | ICD-10-CM | POA: Diagnosis not present

## 2023-07-25 DIAGNOSIS — M545 Low back pain, unspecified: Secondary | ICD-10-CM | POA: Diagnosis not present

## 2023-08-16 DIAGNOSIS — M810 Age-related osteoporosis without current pathological fracture: Secondary | ICD-10-CM | POA: Diagnosis not present

## 2023-08-16 DIAGNOSIS — N183 Chronic kidney disease, stage 3 unspecified: Secondary | ICD-10-CM | POA: Diagnosis not present

## 2023-08-16 DIAGNOSIS — I129 Hypertensive chronic kidney disease with stage 1 through stage 4 chronic kidney disease, or unspecified chronic kidney disease: Secondary | ICD-10-CM | POA: Diagnosis not present

## 2023-08-16 DIAGNOSIS — E7800 Pure hypercholesterolemia, unspecified: Secondary | ICD-10-CM | POA: Diagnosis not present

## 2023-08-16 DIAGNOSIS — E039 Hypothyroidism, unspecified: Secondary | ICD-10-CM | POA: Diagnosis not present

## 2023-08-21 DIAGNOSIS — H6123 Impacted cerumen, bilateral: Secondary | ICD-10-CM | POA: Diagnosis not present

## 2023-08-21 DIAGNOSIS — Z974 Presence of external hearing-aid: Secondary | ICD-10-CM | POA: Diagnosis not present

## 2023-08-22 DIAGNOSIS — M545 Low back pain, unspecified: Secondary | ICD-10-CM | POA: Diagnosis not present

## 2023-09-06 DIAGNOSIS — M545 Low back pain, unspecified: Secondary | ICD-10-CM | POA: Diagnosis not present

## 2023-09-17 DIAGNOSIS — R194 Change in bowel habit: Secondary | ICD-10-CM | POA: Diagnosis not present

## 2023-09-24 DIAGNOSIS — M810 Age-related osteoporosis without current pathological fracture: Secondary | ICD-10-CM | POA: Diagnosis not present

## 2023-09-24 DIAGNOSIS — I10 Essential (primary) hypertension: Secondary | ICD-10-CM | POA: Diagnosis not present

## 2023-09-26 DIAGNOSIS — M545 Low back pain, unspecified: Secondary | ICD-10-CM | POA: Diagnosis not present

## 2023-10-11 DIAGNOSIS — M545 Low back pain, unspecified: Secondary | ICD-10-CM | POA: Diagnosis not present

## 2023-10-31 DIAGNOSIS — M545 Low back pain, unspecified: Secondary | ICD-10-CM | POA: Diagnosis not present

## 2023-11-06 ENCOUNTER — Other Ambulatory Visit (HOSPITAL_COMMUNITY): Payer: Self-pay | Admitting: Family Medicine

## 2023-11-06 DIAGNOSIS — I129 Hypertensive chronic kidney disease with stage 1 through stage 4 chronic kidney disease, or unspecified chronic kidney disease: Secondary | ICD-10-CM

## 2023-11-07 DIAGNOSIS — H04123 Dry eye syndrome of bilateral lacrimal glands: Secondary | ICD-10-CM | POA: Diagnosis not present

## 2023-11-07 DIAGNOSIS — Z961 Presence of intraocular lens: Secondary | ICD-10-CM | POA: Diagnosis not present

## 2023-11-07 DIAGNOSIS — H26493 Other secondary cataract, bilateral: Secondary | ICD-10-CM | POA: Diagnosis not present

## 2023-11-07 DIAGNOSIS — H524 Presbyopia: Secondary | ICD-10-CM | POA: Diagnosis not present

## 2023-11-11 ENCOUNTER — Ambulatory Visit (HOSPITAL_COMMUNITY)
Admission: RE | Admit: 2023-11-11 | Discharge: 2023-11-11 | Disposition: A | Discharge status: Home / Self Care | Payer: Self-pay | Source: Ambulatory Visit | Attending: Family Medicine | Admitting: Family Medicine

## 2023-11-11 DIAGNOSIS — I129 Hypertensive chronic kidney disease with stage 1 through stage 4 chronic kidney disease, or unspecified chronic kidney disease: Secondary | ICD-10-CM | POA: Insufficient documentation

## 2023-11-14 DIAGNOSIS — M545 Low back pain, unspecified: Secondary | ICD-10-CM | POA: Diagnosis not present

## 2023-11-20 DIAGNOSIS — Z1231 Encounter for screening mammogram for malignant neoplasm of breast: Secondary | ICD-10-CM | POA: Diagnosis not present

## 2023-11-26 DIAGNOSIS — D225 Melanocytic nevi of trunk: Secondary | ICD-10-CM | POA: Diagnosis not present

## 2023-11-26 DIAGNOSIS — L308 Other specified dermatitis: Secondary | ICD-10-CM | POA: Diagnosis not present

## 2023-11-26 DIAGNOSIS — L821 Other seborrheic keratosis: Secondary | ICD-10-CM | POA: Diagnosis not present

## 2023-11-26 DIAGNOSIS — Z85828 Personal history of other malignant neoplasm of skin: Secondary | ICD-10-CM | POA: Diagnosis not present

## 2023-11-29 DIAGNOSIS — M545 Low back pain, unspecified: Secondary | ICD-10-CM | POA: Diagnosis not present

## 2023-12-13 DIAGNOSIS — M545 Low back pain, unspecified: Secondary | ICD-10-CM | POA: Diagnosis not present

## 2023-12-26 DIAGNOSIS — M545 Low back pain, unspecified: Secondary | ICD-10-CM | POA: Diagnosis not present

## 2023-12-26 DIAGNOSIS — H26492 Other secondary cataract, left eye: Secondary | ICD-10-CM | POA: Diagnosis not present

## 2024-01-19 DIAGNOSIS — R931 Abnormal findings on diagnostic imaging of heart and coronary circulation: Secondary | ICD-10-CM | POA: Insufficient documentation

## 2024-01-19 NOTE — Progress Notes (Unsigned)
 Cardiology Office Note   Date:  01/23/2024   ID:  Melyna, Huron [birth year], MRN [id number]  PCP:  Lupita Raider, MD  Cardiologist:   Rollene Rotunda, MD Referring:  Lupita Raider, MD  Chief Complaint  Patient presents with   Hypertension      History of Present Illness: Laura Raymond is a 83 y.o. female who presents for evaluation of difficult to control hypertension.  The patient had CAD with a calcium score of 52.1 which was 34th percentile.    Echo in 2021 demonstrated an EF of 60 - 65%.  There was moderately elevated pulmonary pressures at 49.7 mm Hg.  There was mild aortic sclerosis.    She has had no other past cardiac history.  She does pretty well.  She has a lot of anxiety.  She does not exercise a lot and she is just starting to do this.  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  With the level of activity that she does do she denies any cardiovascular symptoms. The patient denies any new symptoms such as chest discomfort, neck or arm discomfort. There has been no new shortness of breath, PND or orthopnea. There have been no reported palpitations, presyncope or syncope.    Past Medical History:  Diagnosis Date   Anxiety    CKD (chronic kidney disease)    Goiter    Hypertension    IBS (irritable bowel syndrome)    Mitral valve prolapse    Osteoporosis    Thyroid disease     Past Surgical History:  Procedure Laterality Date   ABDOMINAL HYSTERECTOMY  1999   ABLATION     APPENDECTOMY  1962   BREAST LUMPECTOMY Left 1968   COLONOSCOPY  08/26/2017   endocopsy  08/26/2017     Current Outpatient Medications  Medication Sig Dispense Refill   ALPRAZolam (XANAX) 0.25 MG tablet Take 0.25 mg by mouth at bedtime as needed for anxiety.     amLODipine (NORVASC) 2.5 MG tablet Take 1 tablet (2.5 mg total) by mouth daily. 180 tablet 3   aspirin EC 81 MG tablet Take 81 mg by mouth daily.     Calcium Carbonate-Vitamin D (CALTRATE 600+D PO) Take by mouth.     denosumab  (PROLIA) 60 MG/ML SOSY injection Inject 60 mg into the skin every 6 (six) months.     FLUoxetine (PROZAC) 40 MG capsule Take 40 mg by mouth daily.     hydrALAZINE (APRESOLINE) 10 MG tablet Take one tablet every 8 hours as needed for SBP greater than 180. 180 tablet 3   hydrochlorothiazide (MICROZIDE) 12.5 MG capsule Take 12.5 mg by mouth every morning.     levothyroxine (SYNTHROID, LEVOTHROID) 112 MCG tablet Take 112 mcg by mouth daily before breakfast.     metoprolol tartrate (LOPRESSOR) 25 MG tablet Take 25 mg by mouth 2 (two) times daily.     pantoprazole (PROTONIX) 40 MG tablet Take 40 mg by mouth daily.     VALACYCLOVIR HCL PO Take by mouth.     pravastatin (PRAVACHOL) 80 MG tablet Take 1 tablet (80 mg total) by mouth daily. 90 tablet 3   No current facility-administered medications for this visit.    Allergies:   Codeine    Social History:  The patient  reports that she has never smoked. She has never used smokeless tobacco.   Family History:  The patient's family history includes Heart disease (age of onset: 22) in her brother; Heart disease (age of  onset: 47) in her father.    ROS:  Please see the history of present illness.   Otherwise, review of systems are positive for none.   All other systems are reviewed and negative.    PHYSICAL EXAM: VS:  BP (!) 168/68 (BP Location: Right Arm, Patient Position: Sitting, Cuff Size: Normal)   Pulse (!) 55   Ht 4\' 11"  (1.499 m)   Wt 114 lb 3.2 oz (51.8 kg)   SpO2 99%   BMI 23.07 kg/m  , BMI Body mass index is 23.07 kg/m. GENERAL:  Well appearing HEENT:  Pupils equal round and reactive, fundi not visualized, oral mucosa unremarkable NECK:  No jugular venous distention, waveform within normal limits, carotid upstroke brisk and symmetric, no bruits, no thyromegaly LYMPHATICS:  No cervical, inguinal adenopathy LUNGS:  Clear to auscultation bilaterally BACK:  No CVA tenderness CHEST:  Unremarkable HEART:  PMI not displaced or  sustained,S1 and S2 within normal limits, no S3, no S4, no clicks, no rubs, no murmurs ABD:  Flat, positive bowel sounds normal in frequency in pitch, no bruits, no rebound, no guarding, no midline pulsatile mass, no hepatomegaly, no splenomegaly EXT:  2 plus pulses throughout, no edema, no cyanosis no clubbing SKIN:  No rashes no nodules NEURO:  Cranial nerves II through XII grossly intact, motor grossly intact throughout [facility]:  Cognitively intact, oriented to person place and time    EKG:  EKG Interpretation Date/Time:  Thursday January 23 2024 08:55:29 EDT Ventricular Rate:  55 PR Interval:  150 QRS Duration:  88 QT Interval:  446 QTC Calculation: 426 R Axis:   50  Text Interpretation: Sinus bradycardia Confirmed by Rollene Rotunda ([id number]) on 01/23/2024 9:07:52 AM     Recent Labs: No results found for requested labs within last 365 days.    Lipid Panel No results found for: "CHOL", "TRIG", "HDL", "CHOLHDL", "VLDL", "LDLCALC", "LDLDIRECT"    Wt Readings from Last 3 Encounters:  01/23/24 114 lb 3.2 oz (51.8 kg)      Other studies Reviewed: Additional studies/ records that were reviewed today include: Labs and primary care office records. Review of the above records demonstrates:  Please see elsewhere in the note.     ASSESSMENT AND PLAN:  Elevated coronary calcium: She has no symptoms related to this.  No change in therapy.  Elevated pulmonary pressure: This was a mild finding on her echocardiogram and she has no overt physical findings or symptoms.  No change in therapy is indicated.  Abnormal echocardiogram: There was a question of mitral valve prolapse in the past but this was not identified on the most recent echo in 2021.  CKD: Her creatinine was mildly elevated.  Apparently though this was while she was taken off of ARB's in the past.  I will add Norvasc 2.5 mg and give her a prescription for hydralazine 10 mg as needed systolic blood pressure greater than 180.   She can keep a blood pressure diary and send this to me and we can make the next adjustments.  Dyslipidemia: Total pressure is 185.  LDL was 111 with an HDL of 58.  I think the goal LDL should be less than 100 and I will increase her Pravachol to 80 mg daily.  Carotid stenosis:    Repeat carotid Doppler.  She had mild stenosis in the distant past.   Current medicines are reviewed at length with the patient today.  The patient does not have concerns regarding medicines.  The following changes  have been made:  As above  Labs/ tests ordered today include: None  Orders Placed This Encounter  Procedures   EKG 12-Lead   VAS US CAROTID     Disposition:   FU with with me in 3 months.     Signed, Rollene Rotunda, MD  01/23/2024 9:30 AM    [facility]

## 2024-01-21 DIAGNOSIS — M545 Low back pain, unspecified: Secondary | ICD-10-CM | POA: Diagnosis not present

## 2024-01-23 ENCOUNTER — Encounter: Payer: Self-pay | Admitting: Cardiology

## 2024-01-23 ENCOUNTER — Ambulatory Visit: Payer: Medicare PPO | Attending: Cardiology | Admitting: Cardiology

## 2024-01-23 VITALS — BP 168/68 | HR 55 | Ht 59.0 in | Wt 114.2 lb

## 2024-01-23 DIAGNOSIS — I350 Nonrheumatic aortic (valve) stenosis: Secondary | ICD-10-CM

## 2024-01-23 DIAGNOSIS — R931 Abnormal findings on diagnostic imaging of heart and coronary circulation: Secondary | ICD-10-CM

## 2024-01-23 MED ORDER — AMLODIPINE BESYLATE 2.5 MG PO TABS: 2.5000 mg | ORAL_TABLET | Freq: Every day | ORAL | 3 refills | Status: DC

## 2024-01-23 MED ORDER — PRAVASTATIN SODIUM 80 MG PO TABS: 80.0000 mg | ORAL_TABLET | Freq: Every day | ORAL | 3 refills | Status: AC

## 2024-01-23 MED ORDER — HYDRALAZINE HCL 10 MG PO TABS: ORAL_TABLET | ORAL | 3 refills | Status: AC

## 2024-01-23 NOTE — Patient Instructions (Addendum)
 Medication Instructions:  Start amlodipine 2.5 daily, start hydralazine 10 mg every 8 hours as needed for SBP greater than 180. Increase pravachol to 80 mg daily. *If you need a refill on your cardiac medications before your next appointment, please call your pharmacy*  Your physician has requested that you have a carotid duplex. This test is an ultrasound of the carotid arteries in your neck. It looks at blood flow through these arteries that supply the brain with blood. Allow one hour for this exam. There are no restrictions or special instructions.   Please note: We ask at that you not bring children with you during ultrasound (echo/ vascular) testing. Due to room size and safety concerns, children are not allowed in the ultrasound rooms during exams. Our front office staff cannot provide observation of children in our lobby area while testing is being conducted. An adult accompanying a patient to their appointment will only be allowed in the ultrasound room at the discretion of the ultrasound technician under special circumstances. We apologize for any inconvenience.   Follow-Up: At [facility], you and your health needs are our priority.  As part of our continuing mission to provide you with exceptional heart care, our providers are all part of one team.  This team includes your primary Cardiologist (physician) and Advanced Practice Providers or APPs (Physician Assistants and Nurse Practitioners) who all work together to provide you with the care you need, when you need it.  Your next appointment:   3 month(s)  Provider:   Dr Antoine Poche     We recommend signing up for the patient portal called "MyChart".  Sign up information is provided on this After Visit Summary.  MyChart is used to connect with patients for Virtual Visits (Telemedicine).  Patients are able to view lab/test results, encounter notes, upcoming appointments, etc.  Non-urgent messages can be sent to your provider as  well.   To learn more about what you can do with MyChart, go to [email or web address].   Other Instructions Take and record blood pressure as directed by Dr Antoine Poche and record on the BP log sheets provided by office nurse. Can drop off sheets at front office for Dr Antoine Poche to review or enter into My Chart message.       1st Floor: - Lobby - Registration  - Pharmacy  - Lab - Cafe  2nd Floor: - PV Lab - Diagnostic Testing (echo, CT, nuclear med)  3rd Floor: - Vacant  4th Floor: - TCTS (cardiothoracic surgery) - AFib Clinic - Structural Heart Clinic - Vascular Surgery  - Vascular Ultrasound  5th Floor: - HeartCare Cardiology (general and EP) - Clinical Pharmacy for coumadin, hypertension, lipid, weight-loss medications, and med management appointments    Valet parking services will be available as well.

## 2024-01-30 ENCOUNTER — Other Ambulatory Visit: Payer: Self-pay | Admitting: Cardiology

## 2024-01-30 DIAGNOSIS — I350 Nonrheumatic aortic (valve) stenosis: Secondary | ICD-10-CM

## 2024-01-30 DIAGNOSIS — R931 Abnormal findings on diagnostic imaging of heart and coronary circulation: Secondary | ICD-10-CM

## 2024-01-30 DIAGNOSIS — I6529 Occlusion and stenosis of unspecified carotid artery: Secondary | ICD-10-CM

## 2024-02-04 DIAGNOSIS — M545 Low back pain, unspecified: Secondary | ICD-10-CM | POA: Diagnosis not present

## 2024-02-06 ENCOUNTER — Ambulatory Visit (HOSPITAL_COMMUNITY)
Admission: RE | Admit: 2024-02-06 | Discharge: 2024-02-06 | Disposition: A | Discharge status: Home / Self Care | Source: Ambulatory Visit | Attending: Cardiology | Admitting: Cardiology

## 2024-02-06 DIAGNOSIS — I6529 Occlusion and stenosis of unspecified carotid artery: Secondary | ICD-10-CM | POA: Insufficient documentation

## 2024-02-06 DIAGNOSIS — I6523 Occlusion and stenosis of bilateral carotid arteries: Secondary | ICD-10-CM | POA: Diagnosis not present

## 2024-02-07 ENCOUNTER — Encounter: Payer: Self-pay | Admitting: *Deleted

## 2024-02-18 DIAGNOSIS — M545 Low back pain, unspecified: Secondary | ICD-10-CM | POA: Diagnosis not present

## 2024-02-19 DIAGNOSIS — I350 Nonrheumatic aortic (valve) stenosis: Secondary | ICD-10-CM | POA: Diagnosis not present

## 2024-02-19 DIAGNOSIS — Z0000 Encounter for general adult medical examination without abnormal findings: Secondary | ICD-10-CM | POA: Diagnosis not present

## 2024-02-19 DIAGNOSIS — N183 Chronic kidney disease, stage 3 unspecified: Secondary | ICD-10-CM | POA: Diagnosis not present

## 2024-02-19 DIAGNOSIS — H35039 Hypertensive retinopathy, unspecified eye: Secondary | ICD-10-CM | POA: Diagnosis not present

## 2024-02-19 DIAGNOSIS — Z1331 Encounter for screening for depression: Secondary | ICD-10-CM | POA: Diagnosis not present

## 2024-02-19 DIAGNOSIS — E7800 Pure hypercholesterolemia, unspecified: Secondary | ICD-10-CM | POA: Diagnosis not present

## 2024-02-19 DIAGNOSIS — I519 Heart disease, unspecified: Secondary | ICD-10-CM | POA: Diagnosis not present

## 2024-02-19 DIAGNOSIS — E039 Hypothyroidism, unspecified: Secondary | ICD-10-CM | POA: Diagnosis not present

## 2024-02-19 DIAGNOSIS — I2510 Atherosclerotic heart disease of native coronary artery without angina pectoris: Secondary | ICD-10-CM | POA: Diagnosis not present

## 2024-02-19 DIAGNOSIS — I129 Hypertensive chronic kidney disease with stage 1 through stage 4 chronic kidney disease, or unspecified chronic kidney disease: Secondary | ICD-10-CM | POA: Diagnosis not present

## 2024-02-19 LAB — LAB REPORT - SCANNED: EGFR: 40

## 2024-03-25 DIAGNOSIS — M818 Other osteoporosis without current pathological fracture: Secondary | ICD-10-CM | POA: Diagnosis not present

## 2024-03-25 DIAGNOSIS — M545 Low back pain, unspecified: Secondary | ICD-10-CM | POA: Diagnosis not present

## 2024-04-20 DIAGNOSIS — H35039 Hypertensive retinopathy, unspecified eye: Secondary | ICD-10-CM | POA: Diagnosis not present

## 2024-04-20 DIAGNOSIS — N183 Chronic kidney disease, stage 3 unspecified: Secondary | ICD-10-CM | POA: Diagnosis not present

## 2024-04-20 DIAGNOSIS — I350 Nonrheumatic aortic (valve) stenosis: Secondary | ICD-10-CM | POA: Diagnosis not present

## 2024-04-20 DIAGNOSIS — M810 Age-related osteoporosis without current pathological fracture: Secondary | ICD-10-CM | POA: Diagnosis not present

## 2024-04-22 DIAGNOSIS — M545 Low back pain, unspecified: Secondary | ICD-10-CM | POA: Diagnosis not present

## 2024-04-22 NOTE — Progress Notes (Unsigned)
  Cardiology Office Note:   Date:  04/23/2024  ID:  Joellyn, Grandt 1941-05-04, MRN [id number] PCP: Loreli Kins, MD  [facility] Providers Cardiologist:  Lynwood Schilling, MD {  History of Present Illness:   Laura Raymond is a 83 y.o. female who presents for evaluation of difficult to control hypertension.   The patient had CAD with a calcium score of 52.1 which was 34th percentile.    Echo in 2021 demonstrated an EF of 60 - 65%.  There was moderately elevated pulmonary pressures at 49.7 mm Hg.  There was mild aortic sclerosis.     Since I last saw her she has gone well.  She is cut back from [place].  She goes up and down stairs.  She is exercising at [employer].  The patient denies any new symptoms such as chest discomfort, neck or arm discomfort. There has been no new shortness of breath, PND or orthopnea. There have been no reported palpitations, presyncope or syncope.   ROS: As stated in the HPI and negative for all other systems.  Studies Reviewed:    EKG:     NA  Risk Assessment/Calculations:              Physical Exam:   VS:  BP 128/76   Pulse (!) 59   Ht 4' 11 (1.499 m)   Wt 112 lb (50.8 kg)   SpO2 96%   BMI 22.62 kg/m    Wt Readings from Last 3 Encounters:  04/23/24 112 lb (50.8 kg)  01/23/24 114 lb 3.2 oz (51.8 kg)     GEN: Well nourished, well developed in no acute distress NECK: No JVD; No carotid bruits CARDIAC: RRR, no murmurs, rubs, gallops RESPIRATORY:  Clear to auscultation without rales, wheezing or rhonchi  ABDOMEN: Soft, non-tender, non-distended EXTREMITIES:  No edema; No deformity   ASSESSMENT AND PLAN:   Elevated coronary calcium: She has had no symptoms related to this.  Will dissipate with risk reduction.    Elevated pulmonary pressure: I have not seen an echocardiogram since 2021 and this was mild so I will repeat an echocardiogram for evaluation of pulmonary hypertension.   HTN:  I will increase the Norvasc  to 5 mg PO  daily and she will keep a BP diary.   Abnormal echocardiogram: There was a question of mitral valve prolapse in the past but this was not identified on the most recent echo in 2021.  This will be assessed as above.   CKD: Her creatinine was 1.32 most recently and followed by her primary provider.  Dyslipidemia:    LDL was not 111 and now it is 95.  No change in therapy.  I increased her pravastatin  at the last visit.   Carotid stenosis:   There was no significant plaque on Doppler in 2025.   No need for further imaging     Follow up with me in one year.   Signed, Lynwood Schilling, MD

## 2024-04-23 ENCOUNTER — Ambulatory Visit: Attending: Cardiology | Admitting: Cardiology

## 2024-04-23 ENCOUNTER — Encounter: Payer: Self-pay | Admitting: Cardiology

## 2024-04-23 VITALS — BP 128/76 | HR 59 | Ht 59.0 in | Wt 112.0 lb

## 2024-04-23 DIAGNOSIS — I272 Pulmonary hypertension, unspecified: Secondary | ICD-10-CM

## 2024-04-23 MED ORDER — AMLODIPINE BESYLATE 5 MG PO TABS: 5.0000 mg | ORAL_TABLET | Freq: Every day | ORAL | 3 refills | Status: AC

## 2024-04-23 NOTE — Patient Instructions (Addendum)
 Medication Instructions:  Increase Amlodipine  to 5 mg once daily *If you need a refill on your cardiac medications before your next appointment, please call your pharmacy*  Lab Work: NONE If you have labs (blood work) drawn today and your tests are completely normal, you will receive your results only by: MyChart Message (if you have MyChart) OR A paper copy in the mail If you have any lab test that is abnormal or we need to change your treatment, we will call you to review the results.  Testing/Procedures: Echo Your physician has requested that you have an echocardiogram. Echocardiography is a painless test that uses sound waves to create images of your heart. It provides your doctor with information about the size and shape of your heart and how well your heart's chambers and valves are working. This procedure takes approximately one hour. There are no restrictions for this procedure. Please do NOT wear cologne, perfume, aftershave, or lotions (deodorant is allowed). Please arrive 15 minutes prior to your appointment time.  Please note: We ask at that you not bring children with you during ultrasound (echo/ vascular) testing. Due to room size and safety concerns, children are not allowed in the ultrasound rooms during exams. Our front office staff cannot provide observation of children in our lobby area while testing is being conducted. An adult accompanying a patient to their appointment will only be allowed in the ultrasound room at the discretion of the ultrasound technician under special circumstances. We apologize for any inconvenience.   Follow-Up: At [facility], you and your health needs are our priority.  As part of our continuing mission to provide you with exceptional heart care, our providers are all part of one team.  This team includes your primary Cardiologist (physician) and Advanced Practice Providers or APPs (Physician Assistants and Nurse Practitioners) who all  work together to provide you with the care you need, when you need it.  Your next appointment:   1 year(s)  Provider:   Lynwood Schilling, MD    We recommend signing up for the patient portal called MyChart.  Sign up information is provided on this After Visit Summary.  MyChart is used to connect with patients for Virtual Visits (Telemedicine).  Patients are able to view lab/test results, encounter notes, upcoming appointments, etc.  Non-urgent messages can be sent to your provider as well.   To learn more about what you can do with MyChart, go to [email or web address].     Blood pressure Diary: Take you blood pressure twice daily for 10 days and send us  the results on MyChart

## 2024-05-04 DIAGNOSIS — H903 Sensorineural hearing loss, bilateral: Secondary | ICD-10-CM | POA: Diagnosis not present

## 2024-05-06 DIAGNOSIS — M545 Low back pain, unspecified: Secondary | ICD-10-CM | POA: Diagnosis not present

## 2024-05-20 DIAGNOSIS — M545 Low back pain, unspecified: Secondary | ICD-10-CM | POA: Diagnosis not present

## 2024-06-02 DIAGNOSIS — M545 Low back pain, unspecified: Secondary | ICD-10-CM | POA: Diagnosis not present

## 2024-06-08 ENCOUNTER — Ambulatory Visit (HOSPITAL_COMMUNITY)
Admission: RE | Admit: 2024-06-08 | Discharge: 2024-06-08 | Disposition: A | Discharge status: Home / Self Care | Source: Ambulatory Visit | Attending: Cardiovascular Disease | Admitting: Cardiovascular Disease

## 2024-06-08 DIAGNOSIS — I272 Pulmonary hypertension, unspecified: Secondary | ICD-10-CM | POA: Diagnosis not present

## 2024-06-08 LAB — ECHOCARDIOGRAM COMPLETE
AR max vel: 1.99 cm2
AV Area VTI: 1.93 cm2
AV Area mean vel: 1.92 cm2
AV Mean grad: 4 mmHg
AV Peak grad: 7.8 mmHg
Ao pk vel: 1.4 m/s
Area-P 1/2: 4.29 cm2
P 1/2 time: 514 ms
S' Lateral: 2.6 cm

## 2024-06-09 DIAGNOSIS — M545 Low back pain, unspecified: Secondary | ICD-10-CM | POA: Diagnosis not present

## 2024-06-14 ENCOUNTER — Ambulatory Visit: Payer: Self-pay | Admitting: Cardiology

## 2024-06-21 DIAGNOSIS — N183 Chronic kidney disease, stage 3 unspecified: Secondary | ICD-10-CM | POA: Diagnosis not present

## 2024-06-21 DIAGNOSIS — H35039 Hypertensive retinopathy, unspecified eye: Secondary | ICD-10-CM | POA: Diagnosis not present

## 2024-06-21 DIAGNOSIS — I350 Nonrheumatic aortic (valve) stenosis: Secondary | ICD-10-CM | POA: Diagnosis not present

## 2024-06-21 DIAGNOSIS — M810 Age-related osteoporosis without current pathological fracture: Secondary | ICD-10-CM | POA: Diagnosis not present

## 2024-06-22 DIAGNOSIS — M545 Low back pain, unspecified: Secondary | ICD-10-CM | POA: Diagnosis not present

## 2024-07-09 DIAGNOSIS — M545 Low back pain, unspecified: Secondary | ICD-10-CM | POA: Diagnosis not present

## 2024-07-21 DIAGNOSIS — K219 Gastro-esophageal reflux disease without esophagitis: Secondary | ICD-10-CM | POA: Diagnosis not present

## 2024-07-21 DIAGNOSIS — M810 Age-related osteoporosis without current pathological fracture: Secondary | ICD-10-CM | POA: Diagnosis not present

## 2024-07-21 DIAGNOSIS — E785 Hyperlipidemia, unspecified: Secondary | ICD-10-CM | POA: Diagnosis not present

## 2024-07-21 DIAGNOSIS — N183 Chronic kidney disease, stage 3 unspecified: Secondary | ICD-10-CM | POA: Diagnosis not present

## 2024-07-21 DIAGNOSIS — I350 Nonrheumatic aortic (valve) stenosis: Secondary | ICD-10-CM | POA: Diagnosis not present

## 2024-07-21 DIAGNOSIS — I771 Stricture of artery: Secondary | ICD-10-CM | POA: Diagnosis not present

## 2024-07-21 DIAGNOSIS — R32 Unspecified urinary incontinence: Secondary | ICD-10-CM | POA: Diagnosis not present

## 2024-07-21 DIAGNOSIS — H35039 Hypertensive retinopathy, unspecified eye: Secondary | ICD-10-CM | POA: Diagnosis not present

## 2024-07-21 DIAGNOSIS — F325 Major depressive disorder, single episode, in full remission: Secondary | ICD-10-CM | POA: Diagnosis not present

## 2024-07-21 DIAGNOSIS — I2510 Atherosclerotic heart disease of native coronary artery without angina pectoris: Secondary | ICD-10-CM | POA: Diagnosis not present

## 2024-07-21 DIAGNOSIS — I272 Pulmonary hypertension, unspecified: Secondary | ICD-10-CM | POA: Diagnosis not present

## 2024-07-21 DIAGNOSIS — D6949 Other primary thrombocytopenia: Secondary | ICD-10-CM | POA: Diagnosis not present

## 2024-07-21 DIAGNOSIS — M1990 Unspecified osteoarthritis, unspecified site: Secondary | ICD-10-CM | POA: Diagnosis not present

## 2024-07-22 DIAGNOSIS — M545 Low back pain, unspecified: Secondary | ICD-10-CM | POA: Diagnosis not present

## 2024-08-05 DIAGNOSIS — M545 Low back pain, unspecified: Secondary | ICD-10-CM | POA: Diagnosis not present

## 2024-08-19 DIAGNOSIS — M545 Low back pain, unspecified: Secondary | ICD-10-CM | POA: Diagnosis not present

## 2024-08-20 DIAGNOSIS — F411 Generalized anxiety disorder: Secondary | ICD-10-CM | POA: Diagnosis not present

## 2024-08-20 DIAGNOSIS — E039 Hypothyroidism, unspecified: Secondary | ICD-10-CM | POA: Diagnosis not present

## 2024-08-20 DIAGNOSIS — I129 Hypertensive chronic kidney disease with stage 1 through stage 4 chronic kidney disease, or unspecified chronic kidney disease: Secondary | ICD-10-CM | POA: Diagnosis not present

## 2024-08-20 DIAGNOSIS — I2510 Atherosclerotic heart disease of native coronary artery without angina pectoris: Secondary | ICD-10-CM | POA: Diagnosis not present

## 2024-08-20 DIAGNOSIS — N183 Chronic kidney disease, stage 3 unspecified: Secondary | ICD-10-CM | POA: Diagnosis not present

## 2024-08-20 DIAGNOSIS — E7800 Pure hypercholesterolemia, unspecified: Secondary | ICD-10-CM | POA: Diagnosis not present

## 2024-08-20 LAB — LAB REPORT - SCANNED: EGFR: 38

## 2024-08-21 DIAGNOSIS — I350 Nonrheumatic aortic (valve) stenosis: Secondary | ICD-10-CM | POA: Diagnosis not present

## 2024-08-21 DIAGNOSIS — H35039 Hypertensive retinopathy, unspecified eye: Secondary | ICD-10-CM | POA: Diagnosis not present

## 2024-08-21 DIAGNOSIS — M810 Age-related osteoporosis without current pathological fracture: Secondary | ICD-10-CM | POA: Diagnosis not present

## 2024-08-21 DIAGNOSIS — N183 Chronic kidney disease, stage 3 unspecified: Secondary | ICD-10-CM | POA: Diagnosis not present

## 2024-08-23 ENCOUNTER — Ambulatory Visit: Payer: Self-pay | Admitting: Cardiology

## 2024-09-04 DIAGNOSIS — N179 Acute kidney failure, unspecified: Secondary | ICD-10-CM | POA: Diagnosis not present

## 2024-09-25 DIAGNOSIS — I129 Hypertensive chronic kidney disease with stage 1 through stage 4 chronic kidney disease, or unspecified chronic kidney disease: Secondary | ICD-10-CM | POA: Diagnosis not present

## 2024-09-25 DIAGNOSIS — N179 Acute kidney failure, unspecified: Secondary | ICD-10-CM | POA: Diagnosis not present

## 2024-09-25 DIAGNOSIS — M810 Age-related osteoporosis without current pathological fracture: Secondary | ICD-10-CM | POA: Diagnosis not present
